# Patient Record
Sex: Female | Born: 1980
Health system: Southern US, Community
[De-identification: ages and names within clinical notes are randomized; demographics above are authoritative.]

## PROBLEM LIST (undated history)

## (undated) ENCOUNTER — Inpatient Hospital Stay (HOSPITAL_COMMUNITY): Payer: Self-pay

## (undated) DIAGNOSIS — E876 Hypokalemia: Secondary | ICD-10-CM

## (undated) DIAGNOSIS — M797 Fibromyalgia: Secondary | ICD-10-CM

## (undated) DIAGNOSIS — I10 Essential (primary) hypertension: Secondary | ICD-10-CM

## (undated) DIAGNOSIS — R0600 Dyspnea, unspecified: Secondary | ICD-10-CM

## (undated) HISTORY — DX: Hypokalemia: E87.6

## (undated) HISTORY — DX: Fibromyalgia: M79.7

## (undated) HISTORY — PX: TONSILLECTOMY: SUR1361

---

## 2000-12-22 ENCOUNTER — Emergency Department (HOSPITAL_COMMUNITY): Admission: EM | Admit: 2000-12-22 | Discharge: 2000-12-22 | Payer: Self-pay

## 2002-02-25 HISTORY — PX: OTHER SURGICAL HISTORY: SHX169

## 2004-03-22 ENCOUNTER — Emergency Department: Payer: Self-pay | Admitting: Emergency Medicine

## 2006-09-07 ENCOUNTER — Emergency Department: Payer: Self-pay | Admitting: Unknown Physician Specialty

## 2006-09-07 ENCOUNTER — Other Ambulatory Visit: Payer: Self-pay

## 2007-10-19 ENCOUNTER — Ambulatory Visit: Payer: Self-pay | Admitting: Unknown Physician Specialty

## 2007-11-02 ENCOUNTER — Emergency Department: Payer: Self-pay | Admitting: Emergency Medicine

## 2008-01-13 ENCOUNTER — Emergency Department: Payer: Self-pay | Admitting: Emergency Medicine

## 2008-05-31 ENCOUNTER — Emergency Department: Payer: Self-pay | Admitting: Emergency Medicine

## 2009-03-16 ENCOUNTER — Emergency Department: Payer: Self-pay | Admitting: Emergency Medicine

## 2009-06-15 ENCOUNTER — Emergency Department: Payer: Self-pay | Admitting: Emergency Medicine

## 2009-09-07 ENCOUNTER — Ambulatory Visit: Payer: Self-pay | Admitting: Otolaryngology

## 2009-09-25 ENCOUNTER — Emergency Department: Payer: Self-pay | Admitting: Emergency Medicine

## 2009-11-07 ENCOUNTER — Emergency Department: Payer: Self-pay | Admitting: Emergency Medicine

## 2009-11-16 ENCOUNTER — Emergency Department: Payer: Self-pay | Admitting: Emergency Medicine

## 2009-11-22 ENCOUNTER — Ambulatory Visit: Payer: Self-pay | Admitting: Obstetrics & Gynecology

## 2009-11-22 ENCOUNTER — Encounter: Payer: Self-pay | Admitting: Obstetrics & Gynecology

## 2009-11-22 LAB — CONVERTED CEMR LAB
Antibody Screen: NEGATIVE
Basophils Absolute: 0 10*3/uL (ref 0.0–0.1)
Basophils Relative: 0 % (ref 0–1)
Eosinophils Absolute: 0 10*3/uL (ref 0.0–0.7)
Eosinophils Relative: 0 % (ref 0–5)
HCT: 30.3 % — ABNORMAL LOW (ref 36.0–46.0)
Hemoglobin: 9.5 g/dL — ABNORMAL LOW (ref 12.0–15.0)
Hepatitis B Surface Ag: NEGATIVE
Lymphocytes Relative: 32 % (ref 12–46)
Lymphs Abs: 2 10*3/uL (ref 0.7–4.0)
MCHC: 31.4 g/dL (ref 30.0–36.0)
MCV: 81.5 fL (ref 78.0–100.0)
Monocytes Absolute: 0.5 10*3/uL (ref 0.1–1.0)
Monocytes Relative: 8 % (ref 3–12)
Neutro Abs: 3.6 10*3/uL (ref 1.7–7.7)
Neutrophils Relative %: 60 % (ref 43–77)
Platelets: 274 10*3/uL (ref 150–400)
RBC: 3.72 M/uL — ABNORMAL LOW (ref 3.87–5.11)
RDW: 15.9 % — ABNORMAL HIGH (ref 11.5–15.5)
Rh Type: POSITIVE
Rubella: 27 intl units/mL — ABNORMAL HIGH
WBC: 6.1 10*3/uL (ref 4.0–10.5)
hCG, Beta Chain, Quant, S: 59204 milliintl units/mL

## 2009-11-23 ENCOUNTER — Ambulatory Visit (HOSPITAL_COMMUNITY): Admission: RE | Admit: 2009-11-23 | Discharge: 2009-11-23 | Payer: Self-pay | Admitting: Anesthesiology

## 2009-11-27 ENCOUNTER — Ambulatory Visit: Payer: Self-pay | Admitting: Obstetrics & Gynecology

## 2009-11-30 ENCOUNTER — Ambulatory Visit (HOSPITAL_COMMUNITY): Admission: RE | Admit: 2009-11-30 | Discharge: 2009-11-30 | Payer: Self-pay | Admitting: Obstetrics & Gynecology

## 2009-11-30 ENCOUNTER — Ambulatory Visit: Payer: Self-pay | Admitting: Obstetrics & Gynecology

## 2010-05-10 LAB — BASIC METABOLIC PANEL
BUN: 6 mg/dL (ref 6–23)
CO2: 25 mEq/L (ref 19–32)
Calcium: 9.4 mg/dL (ref 8.4–10.5)
Chloride: 98 mEq/L (ref 96–112)
Creatinine, Ser: 0.5 mg/dL (ref 0.4–1.2)
GFR calc Af Amer: >60 mL/min (ref >60)
GFR calc non Af Amer: >60 mL/min (ref >60)
Glucose, Bld: 91 mg/dL (ref 70–99)
Potassium: 3.7 mEq/L (ref 3.5–5.1)
Sodium: 131 mEq/L — ABNORMAL LOW (ref 135–145)

## 2010-05-10 LAB — CBC
HCT: 32.1 % — ABNORMAL LOW (ref 36.0–46.0)
Hemoglobin: 10.7 g/dL — ABNORMAL LOW (ref 12.0–15.0)
MCH: 27.1 pg (ref 26.0–34.0)
MCHC: 33.3 g/dL (ref 30.0–36.0)
MCV: 81.2 fL (ref 78.0–100.0)
Platelets: 261 10*3/uL (ref 150–400)
RBC: 3.95 MIL/uL (ref 3.87–5.11)
RDW: 15.2 % (ref 11.5–15.5)
WBC: 6.9 10*3/uL (ref 4.0–10.5)

## 2010-10-11 ENCOUNTER — Ambulatory Visit: Payer: Self-pay

## 2010-10-20 ENCOUNTER — Ambulatory Visit: Payer: Self-pay | Admitting: Family Medicine

## 2011-03-18 ENCOUNTER — Ambulatory Visit: Payer: Self-pay

## 2011-05-16 ENCOUNTER — Ambulatory Visit: Payer: Self-pay

## 2011-05-27 ENCOUNTER — Ambulatory Visit: Payer: Self-pay

## 2011-09-17 ENCOUNTER — Ambulatory Visit: Payer: Self-pay | Admitting: Emergency Medicine

## 2011-09-17 LAB — URINALYSIS, COMPLETE
Blood: NEGATIVE
Ketone: NEGATIVE
Ph: 5 (ref 4.5–8.0)
Specific Gravity: >=1.03 (ref 1.003–1.030)

## 2011-09-18 LAB — URINE CULTURE

## 2012-01-11 ENCOUNTER — Ambulatory Visit: Payer: Self-pay | Admitting: Physician Assistant

## 2012-07-31 LAB — TSH: TSH: 1.01 u[IU]/mL (ref <=5.90)

## 2012-09-28 ENCOUNTER — Encounter (HOSPITAL_COMMUNITY): Payer: Self-pay | Admitting: Emergency Medicine

## 2012-09-28 ENCOUNTER — Emergency Department (HOSPITAL_COMMUNITY)
Admission: EM | Admit: 2012-09-28 | Discharge: 2012-09-28 | Disposition: A | Discharge status: Home / Self Care | Payer: 59 | Attending: Emergency Medicine | Admitting: Emergency Medicine

## 2012-09-28 DIAGNOSIS — Y92410 Unspecified street and highway as the place of occurrence of the external cause: Secondary | ICD-10-CM | POA: Insufficient documentation

## 2012-09-28 DIAGNOSIS — Z79899 Other long term (current) drug therapy: Secondary | ICD-10-CM | POA: Insufficient documentation

## 2012-09-28 DIAGNOSIS — R0602 Shortness of breath: Secondary | ICD-10-CM | POA: Insufficient documentation

## 2012-09-28 DIAGNOSIS — M79609 Pain in unspecified limb: Secondary | ICD-10-CM | POA: Insufficient documentation

## 2012-09-28 DIAGNOSIS — F17200 Nicotine dependence, unspecified, uncomplicated: Secondary | ICD-10-CM | POA: Insufficient documentation

## 2012-09-28 DIAGNOSIS — Y9389 Activity, other specified: Secondary | ICD-10-CM | POA: Insufficient documentation

## 2012-09-28 DIAGNOSIS — S90569A Insect bite (nonvenomous), unspecified ankle, initial encounter: Secondary | ICD-10-CM | POA: Insufficient documentation

## 2012-09-28 DIAGNOSIS — W57XXXA Bitten or stung by nonvenomous insect and other nonvenomous arthropods, initial encounter: Secondary | ICD-10-CM | POA: Insufficient documentation

## 2012-09-28 DIAGNOSIS — I10 Essential (primary) hypertension: Secondary | ICD-10-CM | POA: Insufficient documentation

## 2012-09-28 DIAGNOSIS — R209 Unspecified disturbances of skin sensation: Secondary | ICD-10-CM | POA: Insufficient documentation

## 2012-09-28 DIAGNOSIS — L299 Pruritus, unspecified: Secondary | ICD-10-CM | POA: Insufficient documentation

## 2012-09-28 HISTORY — DX: Essential (primary) hypertension: I10

## 2012-09-28 MED ORDER — DIPHENHYDRAMINE HCL 25 MG PO TABS: 25.0000 mg | ORAL_TABLET | Freq: Four times a day (QID) | ORAL | Status: DC

## 2012-09-28 MED ORDER — HYDROCORTISONE 1 % EX CREA: TOPICAL_CREAM | Freq: Two times a day (BID) | CUTANEOUS | Status: DC

## 2012-09-28 MED ORDER — DIPHENHYDRAMINE HCL 25 MG PO CAPS
50.0000 mg | ORAL_CAPSULE | Freq: Once | ORAL | Status: AC
  Administered 2012-09-28: 50 mg via ORAL
  Filled 2012-09-28: qty 2

## 2012-09-28 MED ORDER — KETOROLAC TROMETHAMINE 60 MG/2ML IM SOLN
60.0000 mg | Freq: Once | INTRAMUSCULAR | Status: AC
  Administered 2012-09-28: 60 mg via INTRAMUSCULAR
  Filled 2012-09-28: qty 2

## 2012-09-28 NOTE — ED Provider Notes (Signed)
CSN: [id number]     Arrival date & time 09/28/12  4098 History     First MD Initiated Contact with Patient 09/28/12 [phone]     Chief Complaint  Patient presents with  . Insect Bite   (Consider location/radiation/quality/duration/timing/severity/associated sxs/prior Treatment) HPI Comments: Patient is a 32 year old female who presents today after getting bitten by an insect while driving to work. She states that she began to feel short of breath as well as a tingling sensation in her arms and legs. At that time she pulled over and called EMS. Nothing makes her pain better, Palpation makes the pain from the bite on her proximal left thigh feel worse. The pain is a burning sensation. She also is beginning to itch diffusely over her body. She has never had pain like this in the past. She has been ambulatory since the time of the bite. She was feeling well prior to the bite. No fevers, chills, nausea, vomiting, abdominal pain, weakness.   The history is provided by the patient. No language interpreter was used.    No past medical history on file. No past surgical history on file. No family history on file. History  Substance Use Topics  . Smoking status: Not on file  . Smokeless tobacco: Not on file  . Alcohol Use: Not on file   OB History   No data available     Review of Systems  Constitutional: Negative for fever and chills.  Respiratory: Positive for shortness of breath.   Cardiovascular: Negative for chest pain.  Gastrointestinal: Negative for nausea, vomiting and abdominal pain.  Neurological: Positive for numbness. Negative for weakness.  All other systems reviewed and are negative.    Allergies  Review of patient's allergies indicates not on file.  Home Medications  No current outpatient prescriptions on file. BP 118/82  Pulse 82  Temp(Src) 98.3 F (36.8 C) (Oral)  Resp 16  SpO2 100%  LMP 09/27/2012 Physical Exam  Nursing note and vitals reviewed. Constitutional:  She is oriented to person, place, and time. Vital signs are normal. She appears well-developed and well-nourished.  Non-toxic appearance. She does not have a sickly appearance. She does not appear ill. No distress. She is not intubated.  HENT:  Head: Normocephalic and atraumatic.  Right Ear: External ear normal.  Left Ear: External ear normal.  Nose: Nose normal.  Mouth/Throat: Uvula is midline, oropharynx is clear and moist and mucous membranes are normal.  Eyes: Conjunctivae are normal.  Neck: Normal range of motion.  Cardiovascular: Normal rate, regular rhythm, normal heart sounds, intact distal pulses and normal pulses.   Pulmonary/Chest: Effort normal and breath sounds normal. No accessory muscle usage or stridor. No apnea, not tachypneic and not bradypneic. She is not intubated. No respiratory distress. She has no wheezes. She has no rales.  Maintaining own secretions. No distress. Speaking in full sentences.   Abdominal: Soft. She exhibits no distension.  Musculoskeletal: Normal range of motion.  Neurological: She is alert and oriented to person, place, and time. She has normal strength. No sensory deficit. Coordination and gait normal.  strength 5/5 in all extremities  Skin: Skin is warm and dry. She is not diaphoretic. There is erythema.  2 cm area of erythema without induration or fluctuance on left anterior proximal thigh. There is currently a circle drawn around the erythema.   Psychiatric: She has a normal mood and affect. Her behavior is normal.    ED Course   Procedures (including critical care  time)  Labs Reviewed - No data to display No results found. 1. Insect bite     MDM  Patient with insect bite to her left leg. No signs of cellulitis, infection. Mild allergic reaction. Patient re-evaluated prior to dc, is hemodynamically stable, in no respiratory distress, and denies the feeling of throat closing. Pt has been advised to take OTC benadryl & return to the ED if they  have a mod-severe allergic rxn (s/s including throat closing, difficulty breathing, swelling of lips face or tongue). Pt is to follow up with their PCP. Pt is agreeable with plan & verbalizes understanding.   Mora Bellman, PA-C 09/28/12 1127

## 2012-09-28 NOTE — ED Notes (Signed)
TO ED via [facility] -- with c/o insect bite -- unsure of what type of insect- now states "feels funny" c/o tingling feeling in arms -- 1 hive/bite on left upper leg- red/slightly swollen. Ambulatory from ambulance to room A4 with no resp distress

## 2012-10-01 NOTE — ED Provider Notes (Signed)
Medical screening examination/treatment/procedure(s) were performed by non-physician practitioner and as supervising physician I was immediately available for consultation/collaboration.  Candyce Churn, MD 10/01/12 [phone]

## 2013-05-03 ENCOUNTER — Ambulatory Visit: Payer: Self-pay | Admitting: Physician Assistant

## 2013-09-11 ENCOUNTER — Ambulatory Visit: Payer: Self-pay

## 2013-09-11 LAB — URINALYSIS, COMPLETE
BACTERIA: NEGATIVE
BILIRUBIN, UR: NEGATIVE
GLUCOSE, UR: NEGATIVE mg/dL (ref 0–75)
Ketone: NEGATIVE
Leukocyte Esterase: NEGATIVE
Nitrite: NEGATIVE
PH: 5 (ref 4.5–8.0)
PROTEIN: NEGATIVE
RBC, UR: NONE SEEN /HPF (ref 0–5)
Specific Gravity: 1.015 (ref 1.003–1.030)

## 2013-09-11 LAB — PREGNANCY, URINE: Pregnancy Test, Urine: NEGATIVE m[IU]/mL

## 2013-09-13 LAB — URINE CULTURE

## 2013-11-10 ENCOUNTER — Encounter (HOSPITAL_COMMUNITY): Payer: Self-pay | Admitting: *Deleted

## 2013-11-10 ENCOUNTER — Inpatient Hospital Stay (HOSPITAL_COMMUNITY)
Admission: AD | Admit: 2013-11-10 | Discharge: 2013-11-10 | Discharge status: Against Medical Advice | Payer: 59 | Source: Ambulatory Visit | Attending: Obstetrics & Gynecology | Admitting: Obstetrics & Gynecology

## 2013-11-10 ENCOUNTER — Inpatient Hospital Stay (HOSPITAL_COMMUNITY): Payer: 59

## 2013-11-10 DIAGNOSIS — R109 Unspecified abdominal pain: Secondary | ICD-10-CM

## 2013-11-10 DIAGNOSIS — O99330 Smoking (tobacco) complicating pregnancy, unspecified trimester: Secondary | ICD-10-CM | POA: Insufficient documentation

## 2013-11-10 DIAGNOSIS — O99891 Other specified diseases and conditions complicating pregnancy: Secondary | ICD-10-CM | POA: Insufficient documentation

## 2013-11-10 DIAGNOSIS — O26899 Other specified pregnancy related conditions, unspecified trimester: Secondary | ICD-10-CM

## 2013-11-10 DIAGNOSIS — O9989 Other specified diseases and conditions complicating pregnancy, childbirth and the puerperium: Principal | ICD-10-CM

## 2013-11-10 DIAGNOSIS — O161 Unspecified maternal hypertension, first trimester: Secondary | ICD-10-CM

## 2013-11-10 LAB — CBC
HCT: 30.6 % — ABNORMAL LOW (ref 36.0–46.0)
Hemoglobin: 9.7 g/dL — ABNORMAL LOW (ref 12.0–15.0)
MCH: 22.3 pg — AB (ref 26.0–34.0)
MCHC: 31.7 g/dL (ref 30.0–36.0)
MCV: 70.3 fL — ABNORMAL LOW (ref 78.0–100.0)
PLATELETS: 316 10*3/uL (ref 150–400)
RBC: 4.35 MIL/uL (ref 3.87–5.11)
RDW: 16.7 % — AB (ref 11.5–15.5)
WBC: 7.1 10*3/uL (ref 4.0–10.5)

## 2013-11-10 LAB — HCG, QUANTITATIVE, PREGNANCY: HCG, BETA CHAIN, QUANT, S: 8225 m[IU]/mL — AB (ref <5)

## 2013-11-10 LAB — WET PREP, GENITAL
Trich, Wet Prep: NONE SEEN
Yeast Wet Prep HPF POC: NONE SEEN

## 2013-11-10 LAB — URINALYSIS, ROUTINE W REFLEX MICROSCOPIC
Bilirubin Urine: NEGATIVE
Glucose, UA: NEGATIVE mg/dL
Hgb urine dipstick: NEGATIVE
Ketones, ur: NEGATIVE mg/dL
LEUKOCYTES UA: NEGATIVE
NITRITE: NEGATIVE
PH: 6.5 (ref 5.0–8.0)
Protein, ur: NEGATIVE mg/dL
SPECIFIC GRAVITY, URINE: 1.01 (ref 1.005–1.030)
UROBILINOGEN UA: 0.2 mg/dL (ref 0.0–1.0)

## 2013-11-10 LAB — POCT PREGNANCY, URINE: Preg Test, Ur: POSITIVE — AB

## 2013-11-10 MED ORDER — HYDROCORTISONE 1 % EX CREA: TOPICAL_CREAM | Freq: Two times a day (BID) | CUTANEOUS | Status: DC

## 2013-11-10 MED ORDER — DIPHENHYDRAMINE HCL 25 MG PO TABS: 25.0000 mg | ORAL_TABLET | Freq: Four times a day (QID) | ORAL | Status: DC

## 2013-11-10 NOTE — MAU Note (Signed)
Pt states she has had a home pregnancy test that was positive. Pt states she started having cramping today "at the bottom of my stomach"

## 2013-11-10 NOTE — Progress Notes (Signed)
Unable to evaluate pt's pain pt left AMA

## 2013-11-10 NOTE — MAU Note (Signed)
Pt returned called pt informed that provider wanted to change her BP medication and that she would get a call from ultrasound for a follow up ultrasound in 1 week

## 2013-11-10 NOTE — MAU Note (Addendum)
Pt called and message left for pt to return call. For Follow up instructions. Pt left prior to discharge.

## 2013-11-10 NOTE — MAU Provider Note (Signed)
History     CSN: [id number]  Arrival date and time: 11/10/13 1511   First Provider Initiated Contact with Patient 11/10/13 1550      Chief Complaint  Patient presents with  . Abdominal Pain  . Possible Pregnancy   HPI Theresa Bowen 33 y.o. [id number] presents to MAU with complaints of cramping.  LMP was 10/04/13.  1 week ago she had a positive home pregnancy test after feeling tired.  3-4 days ago she noticed some cramping that has become more like a pressure all along her lower abdomen and lower back.  Pain is rated 7/10.  She also complains of bloating and leg cramps at night.  She has no dietary changes.  She denies vaginal bleeding/discharge/dysuria.   She has recently noted congestion and shortness of breath but this is typical and related to allergies.  OB History   Grav Para Term Preterm Abortions TAB SAB Ect Mult Living   4 1 1  1   1  1       Past Medical History  Diagnosis Date  . Hypertension     Past Surgical History  Procedure Laterality Date  . Tonsillectomy    . C-section  2004    No family history on file.  History  Substance Use Topics  . Smoking status: Current Every Day Smoker  . Smokeless tobacco: Not on file  . Alcohol Use: No    Allergies: No Known Allergies  Prescriptions prior to admission  Medication Sig Dispense Refill  . acetaminophen (TYLENOL) 325 MG tablet Take 650 mg by mouth once.      . diphenhydrAMINE (BENADRYL) 25 MG tablet Take 1 tablet (25 mg total) by mouth every 6 (six) hours.  20 tablet  0  . hydrocortisone cream 1 % Apply topically 2 (two) times daily.  20 g  0  . lisinopril-hydrochlorothiazide (PRINZIDE,ZESTORETIC) 20-12.5 MG per tablet Take 1 tablet by mouth daily.        Review of Systems  Constitutional: Negative for fever, chills and diaphoresis.  HENT: Positive for congestion. Negative for hearing loss and sore throat.   Respiratory: Positive for shortness of breath. Negative for cough and wheezing.    Cardiovascular: Negative for chest pain and palpitations.  Gastrointestinal: Positive for abdominal pain. Negative for heartburn, nausea, vomiting, diarrhea and constipation.  Genitourinary: Positive for frequency. Negative for dysuria and hematuria.  Musculoskeletal: Positive for back pain.  Skin: Negative for itching and rash.  Neurological: Negative for dizziness, tingling, weakness and headaches.  Psychiatric/Behavioral: Negative for depression, suicidal ideas and substance abuse. The patient is not nervous/anxious.    Physical Exam   Blood pressure 145/79, pulse 122, temperature 99.2 F (37.3 C), temperature source Oral, resp. rate 18, height 5' 1.5" (1.562 m), weight 199 lb (90.266 kg), last menstrual period 10/04/2013.  Physical Exam  Constitutional: She is oriented to person, place, and time. She appears well-developed and well-nourished.  HENT:  Head: Normocephalic and atraumatic.  Eyes: EOM are normal.  Neck: Normal range of motion.  Cardiovascular: Normal rate, regular rhythm and normal heart sounds.   Respiratory: Effort normal and breath sounds normal. No respiratory distress.  GI: Soft. Bowel sounds are normal. She exhibits no distension. There is no tenderness.  Genitourinary:  Moderate amt of thin, white frothy discharge in vagina Cervix is closed.  No CMT/Adnexal tenderness or mass.  Musculoskeletal: Normal range of motion.  Neurological: She is alert and oriented to person, place, and time.  Skin: Skin is warm and  dry.  Psychiatric: She has a normal mood and affect.   Results for orders placed during the hospital encounter of 11/10/13 (from the past 24 hour(s))  URINALYSIS, ROUTINE W REFLEX MICROSCOPIC     Status: Abnormal   Collection Time    11/10/13  3:25 PM      Result Value Ref Range   Color, Urine STRAW (*) YELLOW   APPearance CLEAR  CLEAR   Specific Gravity, Urine 1.010  1.005 - 1.030   pH 6.5  5.0 - 8.0   Glucose, UA NEGATIVE  NEGATIVE mg/dL   Hgb  urine dipstick NEGATIVE  NEGATIVE   Bilirubin Urine NEGATIVE  NEGATIVE   Ketones, ur NEGATIVE  NEGATIVE mg/dL   Protein, ur NEGATIVE  NEGATIVE mg/dL   Urobilinogen, UA 0.2  0.0 - 1.0 mg/dL   Nitrite NEGATIVE  NEGATIVE   Leukocytes, UA NEGATIVE  NEGATIVE  POCT PREGNANCY, URINE     Status: Abnormal   Collection Time    11/10/13  3:29 PM      Result Value Ref Range   Preg Test, Ur POSITIVE (*) NEGATIVE  HCG, QUANTITATIVE, PREGNANCY     Status: Abnormal   Collection Time    11/10/13  4:28 PM      Result Value Ref Range   hCG, Beta Chain, Quant, S 8225 (*) <5 mIU/mL  CBC     Status: Abnormal   Collection Time    11/10/13  4:29 PM      Result Value Ref Range   WBC 7.1  4.0 - 10.5 K/uL   RBC 4.35  3.87 - 5.11 MIL/uL   Hemoglobin 9.7 (*) 12.0 - 15.0 g/dL   HCT 30.6 (*) 36.0 - 46.0 %   MCV 70.3 (*) 78.0 - 100.0 fL   MCH 22.3 (*) 26.0 - 34.0 pg   MCHC 31.7  30.0 - 36.0 g/dL   RDW 16.7 (*) 11.5 - 15.5 %   Platelets 316  150 - 400 K/uL  WET PREP, GENITAL     Status: Abnormal   Collection Time    11/10/13  5:00 PM      Result Value Ref Range   Yeast Wet Prep HPF POC NONE SEEN  NONE SEEN   Trich, Wet Prep NONE SEEN  NONE SEEN   Clue Cells Wet Prep HPF POC FEW (*) NONE SEEN   WBC, Wet Prep HPF POC FEW (*) NONE SEEN   US Ob Comp Less 14 Wks  11/10/2013   CLINICAL DATA:  Cramping for 2 days. LMP 10/04/2013 very by LMP the patient is 5 weeks 2 days. Gravida 4 para 1. History of previous ectopic pregnancy. Quantitative beta HCG is 8,225.  EXAM: OBSTETRIC <14 WK [place] AND TRANSVAGINAL OB US  TECHNIQUE: Both transabdominal and transvaginal ultrasound examinations were performed for complete evaluation of the gestation as well as the maternal uterus, adnexal regions, and pelvic cul-de-sac. Transvaginal technique was performed to assess early pregnancy.  COMPARISON:  None applicable  FINDINGS: Intrauterine gestational sac: Present and irregular in shape  Yolk sac:  Present  Embryo:  Not seen  Cardiac  Activity: Not seen  MSD:  9.9  mm   5 w   5  d  Maternal uterus/adnexae: Right ovary is 4.7 x 2.5 x 2.6 cm. Posterior to the right ovary there is a structure measuring 2.9 x 1.7 x 2.2 cm. This appears discrete from the ovary and may represent soft tissue mass or bowel. The left ovary has a normal  appearance. There is a small amount of free pelvic fluid.  IMPRESSION: 1. Probable intrauterine gestational sac with yolk sac. No embryo is yet visible. 2. Right adnexal structure adjacent to the ovary but discrete from the ovary. Ectopic pregnancy is not entirely excluded. Serial quantitative beta HCG values and follow-up ultrasound are recommended to document progression of pregnancy and to exclude ectopic pregnancy. 3. Critical Value/emergent results were called by telephone at the time of interpretation on 11/10/2013 at 6:37 pm to [facility] , who verbally acknowledged these results.   Electronically Signed   By: Shon Hale M.D.   On: 11/10/2013 18:37   US Ob Transvaginal  11/10/2013   CLINICAL DATA:  Cramping for 2 days. LMP 10/04/2013 very by LMP the patient is 5 weeks 2 days. Gravida 4 para 1. History of previous ectopic pregnancy. Quantitative beta HCG is 8,225.  EXAM: OBSTETRIC <14 WK [place] AND TRANSVAGINAL OB US  TECHNIQUE: Both transabdominal and transvaginal ultrasound examinations were performed for complete evaluation of the gestation as well as the maternal uterus, adnexal regions, and pelvic cul-de-sac. Transvaginal technique was performed to assess early pregnancy.  COMPARISON:  None applicable  FINDINGS: Intrauterine gestational sac: Present and irregular in shape  Yolk sac:  Present  Embryo:  Not seen  Cardiac Activity: Not seen  MSD:  9.9  mm   5 w   5  d  Maternal uterus/adnexae: Right ovary is 4.7 x 2.5 x 2.6 cm. Posterior to the right ovary there is a structure measuring 2.9 x 1.7 x 2.2 cm. This appears discrete from the ovary and may represent soft tissue mass or bowel. The left ovary has a  normal appearance. There is a small amount of free pelvic fluid.  IMPRESSION: 1. Probable intrauterine gestational sac with yolk sac. No embryo is yet visible. 2. Right adnexal structure adjacent to the ovary but discrete from the ovary. Ectopic pregnancy is not entirely excluded. Serial quantitative beta HCG values and follow-up ultrasound are recommended to document progression of pregnancy and to exclude ectopic pregnancy. 3. Critical Value/emergent results were called by telephone at the time of interpretation on 11/10/2013 at 6:37 pm to [facility] , who verbally acknowledged these results.   Electronically Signed   By: Shon Hale M.D.   On: 11/10/2013 18:37     MAU Course  Procedures  MDM Discussed with Dr. Glo Herring.  He advises: pt to return in 1 week for an U/S to confirm viability.    Assessment and Plan  A: Abdominal pain in pregnancy  P: Pt left AMA.  RN to call pt.   Return to MAU in 1 week for U/S to confirm viability.   Precautions to return to MAU for emergency in the meantime: bleeding/severe pain/fever/etc. Planned to discuss BP with pt and need for change in medications.  Will need to address with provider asap.    Paticia Stack 11/10/2013, 4:03 PM

## 2013-11-10 NOTE — MAU Note (Signed)
2+HPT last wk.  Started having cramping and pressure in lower abd and back.  Denies any bleeding;

## 2013-11-11 ENCOUNTER — Other Ambulatory Visit: Payer: Self-pay | Admitting: Medical

## 2013-11-11 DIAGNOSIS — O161 Unspecified maternal hypertension, first trimester: Secondary | ICD-10-CM

## 2013-11-11 LAB — GC/CHLAMYDIA PROBE AMP
CT Probe RNA: NEGATIVE
GC Probe RNA: NEGATIVE

## 2013-11-11 LAB — HIV ANTIBODY (ROUTINE TESTING W REFLEX): HIV: NONREACTIVE

## 2013-11-11 MED ORDER — LABETALOL HCL 200 MG PO TABS: 200.0000 mg | ORAL_TABLET | Freq: Two times a day (BID) | ORAL | Status: DC

## 2013-11-11 NOTE — Progress Notes (Signed)
Patient was seen in MAU on 11/10/13. Confirmed early pregnancy. Patient left AMA prior to discharge. Patient called MAU today stating that PA who saw her yesterday was supposed to change her BP medication. Patient is currently taking Lisinopril. Rx for Labetalol sent to patient's pharmacy. Pharmacy of choice confirmed. Initial dose of Labetalol confirmed with Dr. Ihor Dow.   Luvenia Redden, PA-C 11/11/2013 11:58 AM

## 2013-11-15 ENCOUNTER — Emergency Department: Payer: Self-pay | Admitting: Emergency Medicine

## 2013-11-15 NOTE — MAU Provider Note (Signed)
Attestation of Attending Supervision of Advanced Practitioner: Evaluation and management procedures were performed by the PA/NP/CNM/OB Fellow under my supervision/collaboration. Chart reviewed and agree with management and plan.  Theresa Bowen 11/15/2013 3:09 PM

## 2013-12-27 ENCOUNTER — Encounter (HOSPITAL_COMMUNITY): Payer: Self-pay | Admitting: *Deleted

## 2014-02-25 HISTORY — PX: TUBAL LIGATION: SHX77

## 2014-05-18 ENCOUNTER — Ambulatory Visit: Payer: Self-pay

## 2014-06-03 ENCOUNTER — Inpatient Hospital Stay
Admit: 2014-06-03 | Disposition: A | Discharge status: Home / Self Care | Payer: Self-pay | Attending: Obstetrics and Gynecology | Admitting: Obstetrics and Gynecology

## 2014-06-04 LAB — CBC WITH DIFFERENTIAL/PLATELET
BASOS PCT: 0.2 %
Basophil #: 0 10*3/uL (ref 0.0–0.1)
EOS PCT: 0.1 %
Eosinophil #: 0 10*3/uL (ref 0.0–0.7)
HCT: 27.4 % — AB (ref 35.0–47.0)
HGB: 8.2 g/dL — AB (ref 12.0–16.0)
LYMPHS ABS: 1.2 10*3/uL (ref 1.0–3.6)
Lymphocyte %: 11.9 %
MCH: 20.4 pg — ABNORMAL LOW (ref 26.0–34.0)
MCHC: 30 g/dL — ABNORMAL LOW (ref 32.0–36.0)
MCV: 68 fL — AB (ref 80–100)
MONOS PCT: 6.4 %
Monocyte #: 0.6 x10 3/mm (ref 0.2–0.9)
NEUTROS ABS: 8.1 10*3/uL — AB (ref 1.4–6.5)
Neutrophil %: 81.4 %
Platelet: 229 10*3/uL (ref 150–440)
RBC: 4.02 10*6/uL (ref 3.80–5.20)
RDW: 17.3 % — AB (ref 11.5–14.5)
WBC: 10 10*3/uL (ref 3.6–11.0)

## 2014-06-04 LAB — BASIC METABOLIC PANEL
ANION GAP: 7 (ref 7–16)
BUN: <5 mg/dL
CALCIUM: 7.9 mg/dL — AB
CO2: 23 mmol/L
CREATININE: 0.37 mg/dL — AB
Chloride: 108 mmol/L
GLUCOSE: 117 mg/dL — AB
Potassium: 3.1 mmol/L — ABNORMAL LOW
Sodium: 138 mmol/L

## 2014-06-04 LAB — URINALYSIS, COMPLETE
BLOOD: NEGATIVE
Bilirubin,UR: NEGATIVE
Glucose,UR: NEGATIVE mg/dL (ref 0–75)
Ketone: NEGATIVE
Leukocyte Esterase: NEGATIVE
NITRITE: NEGATIVE
Ph: 7 (ref 4.5–8.0)
SPECIFIC GRAVITY: 1.005 (ref 1.003–1.030)

## 2014-06-04 LAB — PROTEIN / CREATININE RATIO, URINE
CREATININE, URINE RANDOM: 38 mg/dL (ref 30–125)
Protein, Urine: 37 mg/dL (ref 0–9)
Protein/Creat. Ratio: 974 mg/gCREAT — ABNORMAL HIGH (ref 0–200)

## 2014-06-04 LAB — PIH PROFILE
ANION GAP: 7 (ref 7–16)
AST: 21 U/L
BUN: 5 mg/dL — ABNORMAL LOW
CALCIUM: 8.7 mg/dL — AB
CO2: 22 mmol/L
Chloride: 108 mmol/L
Creatinine: 0.39 mg/dL — ABNORMAL LOW
EGFR (African American): >60
Glucose: 101 mg/dL — ABNORMAL HIGH
HCT: 25.2 % — AB (ref 35.0–47.0)
HGB: 7.9 g/dL — ABNORMAL LOW (ref 12.0–16.0)
MCH: 21.2 pg — ABNORMAL LOW (ref 26.0–34.0)
MCHC: 31.6 g/dL — AB (ref 32.0–36.0)
MCV: 67 fL — ABNORMAL LOW (ref 80–100)
PLATELETS: 211 10*3/uL (ref 150–440)
POTASSIUM: 2.9 mmol/L — AB
RBC: 3.75 10*6/uL — ABNORMAL LOW (ref 3.80–5.20)
RDW: 16.7 % — AB (ref 11.5–14.5)
Sodium: 137 mmol/L
Uric Acid: 3.5 mg/dL
WBC: 8.9 10*3/uL (ref 3.6–11.0)

## 2014-06-05 LAB — HEMATOCRIT: HCT: 23.7 % — ABNORMAL LOW (ref 35.0–47.0)

## 2014-06-06 LAB — BASIC METABOLIC PANEL
Anion Gap: 8 (ref 7–16)
BUN: <5 mg/dL
CALCIUM: 8.8 mg/dL — AB
CHLORIDE: 107 mmol/L
CO2: 23 mmol/L
CREATININE: 0.48 mg/dL
GLUCOSE: 87 mg/dL
POTASSIUM: 3.3 mmol/L — AB
SODIUM: 138 mmol/L

## 2014-06-20 LAB — SURGICAL PATHOLOGY

## 2014-06-26 NOTE — Consult Note (Signed)
Brief Consult Note: Diagnosis: Pre eclampsia, chronic HTN.   Patient was seen by consultant.   Consult note dictated.   Orders entered.   Discussed with Attending MD.   Comments: 33y/oF with chronic High Blood Pressure (Hypertension) on lisinopril as outpt, now post partum- delivered at 34weeks for pre eclampsia with elevated BP  * Chronic High Blood Pressure (Hypertension) - now worse with oreeclampsia. lisinopril- stopped during pregnancy. on labetelol- dose increased to 600mg  bid and still BP elevated add procardia (pr is breast feeding) adn monitor will need to be followed as outpt to titrate the meds  * Abd distention- simethicone has been added, add colace as well as on pain meds and constipated  * Anemia- iron supplements  * TEDs added for DVT prophylaxis.  Electronic Signatures: Gladstone Lighter (MD)  (Signed 11-Apr-16 11:41)  Authored: Brief Consult Note   Last Updated: 11-Apr-16 11:41 by Gladstone Lighter (MD)

## 2014-06-26 NOTE — Consult Note (Signed)
PATIENT NAME:  Theresa Bowen, Theresa Bowen MR#:  [id number] DATE OF BIRTH:  1980-03-06  DATE OF CONSULTATION:  06/06/2014  REFERRING PHYSICIAN:  Prentice Docker, MD CONSULTING PHYSICIAN:  Gladstone Lighter, MD  ADMITTING PHYSICIAN: Gladstone Lighter, MD  PRIMARY CARE PHYSICIAN: Halina Maidens, MD   REASON FOR CONSULTATION: Postpartum hypertension management.   HISTORY OF PRESENT ILLNESS: Ms. Mallen is a 34 year old African American female with past medical history significant for chronic hypertension, taking lisinopril prior to the pregnancy, presents to the hospital during her 34th week for worsening blood pressure symptoms. The patient had a C-section and delivery done on June 04, 2014. During her pregnancy she was changed over from lisinopril to labetalol 100 mg p.o. b.i.d., which has maintained her blood pressure up until recently when her blood pressure was elevated for which she was admitted, diagnosed with preeclampsia. Right now her labetalol has been titrated up to 600 mg p.o. b.i.d. and still her systolic has been in the range of 150 to 160. The patient says this is her second pregnancy and even during her first pregnancy she had worsening of her chronic hypertension and needed several medications to control it. She denies any headache, other than abdominal pain. No chest pain, dyspnea, or other symptoms.   PAST MEDICAL HISTORY: Chronic hypertension and also history of preeclampsia, worsening hypertension during her first pregnancy.   PAST SURGICAL HISTORY:  1.  Prior C-sections.  2.  Tonsillectomy.   ALLERGIES TO MEDICATIONS: No known drug allergies.   CURRENT MEDICATIONS: 1.  Tylox 5/325 mg 1 to 2 tablets every 4 hours as needed for pain.  2.  Ferrous sulfate 325 mg oral p.o. b.i.d.  3.  Labetalol 600 mg p.o. b.i.d.  4.  Multivitamin 1 tablet p.o. daily.  5.  Colace 100 mg p.o. b.i.d.  6.  Simethicone 80 mg before meals and at bedtime as needed for distention.   SOCIAL HISTORY: Lives  at home with her husband. Has a prior 66 year old daughter. This is her second child. Used to smoke about 1/2 pack per day but completely stopped during pregnancy. No alcohol use.   FAMILY HISTORY: Both parents with hypertension. Has a younger brother who also is diagnosed with hypertension. On mother's side of the family, there is diabetes.   REVIEW OF SYSTEMS: CONSTITUTIONAL: No fever, fatigue, or weakness.  EYES: No blurred vision, double vision, inflammation or glaucoma.  ENT: No tinnitus, ear pain, hearing loss, epistaxis or discharge.  RESPIRATORY: No cough, wheezing, hemoptysis or COPD. CARDIOVASCULAR: No chest pain, orthopnea, edema, arrhythmia, palpitations, or syncope.  GASTROINTESTINAL: No nausea, vomiting. Positive for abdominal pain. No hematemesis or melena.  GENITOURINARY: No dysuria, hematuria, renal calculus, frequency, or incontinence.  ENDOCRINE: No polyuria, nocturia, thyroid problems, heat or cold intolerance.  HEMATOLOGY: Positive for anemia. No easy bruising or bleeding.  SKIN: No acne, rash or lesions.  MUSCULOSKELETAL: No neck fracture, pain, arthritis or gout.  NEUROLOGIC: No numbness, weakness, CVA, TIA or seizures.  PSYCHOLOGICAL: No anxiety, insomnia or depression.   PHYSICAL EXAMINATION: VITAL SIGNS: Temperature 98.5 degrees Fahrenheit, pulse 86, respirations 18, blood pressure 145/91 and pulse ox 98% on room air.  GENERAL: Heavily built, well-nourished female sitting in bed, not in any acute distress.  HEENT: Normocephalic, atraumatic. Pupils equal, round, and reacting to light. Anicteric sclerae. Extraocular movements intact. Oropharynx is clear without erythema, mass, or exudates.  NECK: Supple. No thyromegaly, JVD or carotid bruits. No lymphadenopathy.  LUNGS: Moving air bilaterally. No wheeze or crackles. No of accessory muscles  for breathing.  HEART:  S1 and S2 regular rate and rhythm. She does have a 3/6 systolic murmur. No rubs or gallops.  ABDOMEN:  Obese, soft, nontender. Mild discomfort in the lower abdomen where her incision is present, but normal bowel sounds.  EXTREMITIES: No pedal edema. No clubbing.  EXTREMITIES: She does have 1+ edema in the legs, no clubbing or cyanosis, 2+ dorsalis pedis pulses palpable bilaterally.  SKIN: No acne, rash or lesions.  LYMPHATICS: No cervical or inguinal lymphadenopathy.  NEUROLOGIC: Cranial nerves intact. No focal motor or sensory deficits.  PSYCHOLOGICAL: The patient is awake, alert and oriented x3.   LABORATORY DATA: WBC 10, hemoglobin 8.2, hematocrit 27.4, platelet count 229,000.   Sodium 138, potassium 3.1, chloride 108, bicarb 23, BUN less than 5, creatinine 0.37, glucose 117 and calcium 7.9.  Urinalysis negative for any infection.   ASSESSMENT AND PLAN: A 34 year old female with chronic hypertension, on lisinopril as outpatient, now postpartum, has preeclampsia, delivered at 34 weeks, and elevated blood pressure.  1.  Chronic hypertension with preeclampsia, now worse. Lisinopril has been stopped once pregnancy started and was on labetalol. Dose titrated to 600 p.o. b.i.d. labetalol and still blood pressure elevated so will add Procardia and monitor. The patient will need to be monitored as outpatient as well to titrate the medications.  2.  Abdominal distention. Continue simethicone. I also added Colace as the patient on pain medications and is constipated.  3.  Anemia. Iron supplements.  4.  Hyperkalemia 2 days ago. Repeat labs to check that.  5.  TEDS added for DVT and her pedal edema.   CODE STATUS: FULL.  TOTAL TIME SPENT ON CONSULTATION: 50 minutes.   ____________________________ Gladstone Lighter, MD rk:sb D: 06/06/2014 13:55:49 ET T: 06/06/2014 14:17:21 ET JOB#: [id number]  cc: Gladstone Lighter, MD, <Dictator> [facility] Halina Maidens, MD Gladstone Lighter MD ELECTRONICALLY SIGNED 06/17/2014 14:49

## 2014-06-26 NOTE — Op Note (Signed)
PATIENT NAME:  Theresa Bowen, Theresa Bowen MR#:  [id number] DATE OF BIRTH:  [birth year]  DATE OF PROCEDURE:  06/04/2014  PREOPERATIVE DIAGNOSES: 90.  A 35 year old G3, P1-0-1-1 with superimposed preeclampsia with severe features at 34 weeks 5 days.  2.  History of prior cesarean section.  3.  Desires permanent surgical sterilization.   POSTOPERATIVE DIAGNOSES:  96.  A 34 year old G3, P1-0-1-1 with superimposed preeclampsia with severe features at 34 weeks 5 days.  2.  History of prior cesarean section.  3.  Desires permanent surgical sterilization.   PROCEDURE PERFORMED: Repeat low transverse cesarean section via Pfannenstiel skin incision and bilateral partial salpingectomy using a Pomeroy method.  PRIMARY SURGEON: Malachy Mood, M.D.   ASSISTANT: Prentice Docker, MD  ANESTHESIA: Spinal.   PREOPERATIVE ANTIBIOTICS: 2 grams Ancef.  DRAINS OR TUBES: Foley to gravity drainage, On-Q catheter system.   ESTIMATED BLOOD LOSS: 750 mL.  OPERATIVE FLUIDS: 2 liters.   URINE OUTPUT: 250 mL.   COMPLICATIONS: None.   FINDINGS: Normal tubes, uterus and ovaries. Minimal scarring of the rectus fascia. Delivery resulting in the birth of a liveborn female infant weighing 2500 grams (5 pounds, 8 ounces). Apgars 7 and 9.   SPECIMENS REMOVED: Portion of right and left fallopian tube.   CONDITION FOLLOWING PROCEDURE: Stable.   PROCEDURE IN DETAIL: Risks, benefits, and alternatives of the procedure were discussed with the patient prior to proceeding to the operating room. The patient was taken to the operating room where she was administered spinal anesthesia. She was positioned in the supine position, prepped and draped in the usual sterile fashion. A timeout was performed. The level of anesthetic was checked and noted to be adequate prior to proceeding with the case. A Pfannenstiel skin incision was made utilizing the patient's pre-existing scar which was carried down to the level of the rectus fascia. The  fascia was incised in the midline. The fascial incision was extended using Mayo scissors. The superior border of the rectus fascia was grasped with 2 Kocher clamps. The underlying rectus muscles were dissected off the fascia bluntly and the median raphe incised using Mayo scissors. Inferior border of the rectus fascia was dissected off the rectus muscle in a similar fashion. The midline was identified and the rectus muscles were separated in the midline using a hemostat. The peritoneum was identified, grasped and entered using Metzenbaum scissors after transilluminating the peritoneum to verify a clear window. The peritoneal opening was then extended using manual traction. A bladder blade was placed. A low transverse incision was scored on the uterus. The uterus was entered bluntly using the operator's finger. The uterine incision was extended using manual traction. Amniotomy was performed at time of uterine entry and noted clear fluid. The vertex was noted to be in OA position, grasped, flexed, brought to the incision, delivered atraumatically using fundal pressure. The remainder of the body delivered with ease. The infant was suctioned, cord was clamped and cut and the infant was passed to the awaiting pediatrician. The placenta was delivered using manual extraction. The uterus was then exteriorized, wiped clean of clots and debris using 2 moist laps. The hysterotomy incision was closed using a single layer closure of 0 Vicryl in a running lock fashion. Attention was then turned to the patient's right tube. The right tube was grasped with a Babcock clamp in the mid isthmic portion and doubly ligated using a Pomeroy method and a 0 chromic. The intervening knuckle of tube was then excised. Both tubal ostia were clearly visualized.  The pedicle was noted to be hemostatic. This was then completed on the patient's left tube. There was some adhesions of the left tube to the left ovary and the pedicle slipped out of its  tie requiring the pedicles to be religated. This was accomplished by grasping the knuckle of tube in a hemostat and then using a #1 Vicryl free tie to tie down the knuckle of tube. The hysterotomy incision was reinspected and noted be hemostatic. The uterus was returned to the abdomen. Both tubal sites were reinspected and noted to be hemostatic as well. The peritoneal gutters were wiped clean of clots and debris using 2 moist laps. The rectus muscles were reapproximated in the midline using a 0 Vicryl mattress stitch. The superior border of the rectus fascia was grasped with a Kocher clamp. The On-Q catheters were placed subfascially per the usual protocol. The fascia was then closed using a single looped #1 PDS in a running fashion. Subcutaneous tissue was irrigated. Hemostasis was achieved using the Bovie. Skin was closed using staples. Sponge, needle, and instrument counts were correct x2. The patient tolerated the procedure well and was taken to the recovery room in stable condition.   ____________________________ Stoney Bang. Georgianne Fick, MD ams:sb D: 06/07/2014 12:00:17 ET T: 06/07/2014 13:36:50 ET JOB#: [id number]  cc: Stoney Bang. Georgianne Fick, MD, <Dictator> Dorthula Nettles MD ELECTRONICALLY SIGNED 06/15/2014 22:44

## 2014-07-04 ENCOUNTER — Other Ambulatory Visit: Payer: Medicaid Other

## 2014-07-05 ENCOUNTER — Inpatient Hospital Stay: Admit: 2014-07-05 | Payer: Self-pay | Admitting: Obstetrics and Gynecology

## 2014-07-05 SURGERY — Surgical Case
Anesthesia: Epidural

## 2014-07-05 NOTE — H&P (Signed)
L&D Evaluation:  History Expanded:  HPI 34 yo G3P0111 at [redacted]w[redacted]d gestational age by LMP consistent with 7 week ultrasound.  Pregnancy complicated by obesity (BMI 39), history of prior cesarean delivery, anemia, history of preterm birth, chronic hypertension.  For her chronic hypertension she has been taking labetalol 100mg  po bid.  She has been followed with NSTs and AFIs weekly.  Her most recent growth scan was 1 week ago and the growth percentile was the 23rd.  She presents with two days of elevated blood pressures.  She has been getting systolic readings of about 180 at home. She normally does not have pressures this high.  She also has a headache for which she took one extra strenght tylenol yesterday morning and this did not help.  She notes that she sees dark spots in her visual fields and has had epigastric pain.  She notes shortness of breath also. She states the fetus is moving well, she has had some back pain, but is unsure of contractions.  Denies leakage of fluid and vaginal bleeding. She has also had edema, especially in her legs.   Blood Type (Maternal) A positive   Group B Strep Results Maternal (Result >5wks must be treated as unknown) unknown/result > 5 weeks ago   Maternal HIV Negative   Maternal Syphilis Ab Nonreactive   Maternal Varicella Immune   Rubella Results (Maternal) immune   EDC 11-Jul-2014   Patient's Medical History 1) Obesity, 2) chronic hypertension   Patient's Surgical History D&C  Previous C-Section   Medications 1) Fusiojn plus, 2) ASA 81mg , 3) labetalol 100mg  po bid   Allergies NKDA   Social History none  former tobacco user, denies EtOH and illicit drug use   Family History Non-Contributory   ROS:  ROS All systems were reviewed.  HEENT, CNS, GI, GU, Respiratory, CV, Renal and Musculoskeletal systems were found to be normal., negative unless noted in HPI   Exam:  Vital Signs BPs 158-179/84-108, P80-90s.    General no apparent distress    Mental Status clear    Chest clear    Heart normal sinus rhythm   Abdomen gravid, non-tender   Fetal Position ceph   Back no CVAT   Edema 1+    Reflexes 2+    Mebranes Intact   FHT 145/mod var/+accels/no decels   Ucx irritability   Skin no lesions   Other BSUS: AFI = 8.8cm, fetus ceph, spine maternal right   Impression:  Impression 1) Intrauterine pregnancy at [redacted]w[redacted]d gestational age, 2) chronic hypertension with newly elevated blood pressure   Plan:  Comments 1) chronic hypertension with newly elevated blood pressure: Assess for superimposed preeclampsia. has some vague symptoms that could be consistent with severe features.  However, she does have elevated BPs. WIll attempt to gain control of BPs and see if symptoms resolve. HELLP/Preeclampsia labs -some back, others pending.  Anemic at baseline. usually has normal platelets.  2) Fetus: reassuring at this time  3) method of delivery: cesarean delivery if indicated.   Labs:  Lab Results:  Routine UA:  09-Apr-16 00:21   Protein (UA) 30 mg/dL  Routine Hem:  09-Apr-16 00:21   RBC (CBC)  3.75  Hemoglobin (CBC)  7.9  Hematocrit (CBC)  25.2  Platelet Count (CBC) 211 (Result(s) reported on 04 Jun 2014 at 12:31AM.)   Electronic Signatures: Will Bonnet (MD)  (Signed 09-Apr-16 00:47)  Authored: L&D Evaluation, Labs   Last Updated: 09-Apr-16 00:47 by Will Bonnet (MD)

## 2014-07-20 ENCOUNTER — Encounter: Payer: Self-pay | Admitting: Emergency Medicine

## 2014-07-20 ENCOUNTER — Emergency Department: Payer: 59

## 2014-07-20 ENCOUNTER — Other Ambulatory Visit: Payer: Self-pay

## 2014-07-20 ENCOUNTER — Emergency Department
Admission: EM | Admit: 2014-07-20 | Discharge: 2014-07-21 | Disposition: A | Discharge status: Home / Self Care | Payer: 59 | Attending: Emergency Medicine | Admitting: Emergency Medicine

## 2014-07-20 DIAGNOSIS — R079 Chest pain, unspecified: Secondary | ICD-10-CM

## 2014-07-20 DIAGNOSIS — Z720 Tobacco use: Secondary | ICD-10-CM | POA: Diagnosis not present

## 2014-07-20 DIAGNOSIS — Z3202 Encounter for pregnancy test, result negative: Secondary | ICD-10-CM | POA: Insufficient documentation

## 2014-07-20 DIAGNOSIS — R51 Headache: Secondary | ICD-10-CM | POA: Diagnosis not present

## 2014-07-20 DIAGNOSIS — R519 Headache, unspecified: Secondary | ICD-10-CM

## 2014-07-20 DIAGNOSIS — I159 Secondary hypertension, unspecified: Secondary | ICD-10-CM | POA: Insufficient documentation

## 2014-07-20 DIAGNOSIS — I10 Essential (primary) hypertension: Secondary | ICD-10-CM | POA: Diagnosis present

## 2014-07-20 DIAGNOSIS — Z79899 Other long term (current) drug therapy: Secondary | ICD-10-CM | POA: Insufficient documentation

## 2014-07-20 LAB — URINALYSIS COMPLETE WITH MICROSCOPIC (ARMC ONLY)
BILIRUBIN URINE: NEGATIVE
Glucose, UA: NEGATIVE mg/dL
KETONES UR: NEGATIVE mg/dL
Nitrite: NEGATIVE
PROTEIN: NEGATIVE mg/dL
Specific Gravity, Urine: 1.008 (ref 1.005–1.030)
pH: 8 (ref 5.0–8.0)

## 2014-07-20 LAB — BASIC METABOLIC PANEL
Anion gap: 9 (ref 5–15)
BUN: 6 mg/dL (ref 6–20)
CO2: 26 mmol/L (ref 22–32)
Calcium: 9.4 mg/dL (ref 8.9–10.3)
Chloride: 104 mmol/L (ref 101–111)
Creatinine, Ser: 0.63 mg/dL (ref 0.44–1.00)
GLUCOSE: 101 mg/dL — AB (ref 65–99)
POTASSIUM: 3.1 mmol/L — AB (ref 3.5–5.1)
Sodium: 139 mmol/L (ref 135–145)

## 2014-07-20 LAB — CBC
HEMATOCRIT: 34.6 % — AB (ref 35.0–47.0)
HEMOGLOBIN: 10.8 g/dL — AB (ref 12.0–16.0)
MCH: 22.7 pg — ABNORMAL LOW (ref 26.0–34.0)
MCHC: 31.3 g/dL — ABNORMAL LOW (ref 32.0–36.0)
MCV: 72.6 fL — AB (ref 80.0–100.0)
Platelets: 289 10*3/uL (ref 150–440)
RBC: 4.77 MIL/uL (ref 3.80–5.20)
RDW: 22.1 % — AB (ref 11.5–14.5)
WBC: 6.1 10*3/uL (ref 3.6–11.0)

## 2014-07-20 LAB — HEPATIC FUNCTION PANEL
ALT: 15 U/L (ref 14–54)
AST: 20 U/L (ref 15–41)
Albumin: 4.3 g/dL (ref 3.5–5.0)
Alkaline Phosphatase: 94 U/L (ref 38–126)
BILIRUBIN TOTAL: 0.1 mg/dL — AB (ref 0.3–1.2)
Bilirubin, Direct: <0.1 mg/dL — ABNORMAL LOW (ref 0.1–0.5)
Total Protein: 8.1 g/dL (ref 6.5–8.1)

## 2014-07-20 LAB — TROPONIN I

## 2014-07-20 MED ORDER — HYDRALAZINE HCL 20 MG/ML IJ SOLN
5.0000 mg | Freq: Once | INTRAMUSCULAR | Status: AC
  Administered 2014-07-20: 5 mg via INTRAVENOUS

## 2014-07-20 MED ORDER — MAGNESIUM SULFATE 2 GM/50ML IV SOLN
2.0000 g | Freq: Once | INTRAVENOUS | Status: AC
  Administered 2014-07-20: 2 g via INTRAVENOUS

## 2014-07-20 MED ORDER — KETOROLAC TROMETHAMINE 30 MG/ML IJ SOLN
30.0000 mg | Freq: Once | INTRAMUSCULAR | Status: AC
  Administered 2014-07-20: 30 mg via INTRAVENOUS

## 2014-07-20 MED ORDER — ACETAMINOPHEN 500 MG PO TABS
1000.0000 mg | ORAL_TABLET | Freq: Once | ORAL | Status: AC
  Administered 2014-07-20: 1000 mg via ORAL

## 2014-07-20 MED ORDER — MAGNESIUM SULFATE 2 GM/50ML IV SOLN
INTRAVENOUS | Status: AC
  Administered 2014-07-20: 2 g via INTRAVENOUS
  Filled 2014-07-20: qty 50

## 2014-07-20 MED ORDER — ACETAMINOPHEN 500 MG PO TABS
ORAL_TABLET | ORAL | Status: AC
  Administered 2014-07-20: 1000 mg via ORAL
  Filled 2014-07-20: qty 2

## 2014-07-20 MED ORDER — KETOROLAC TROMETHAMINE 30 MG/ML IJ SOLN
INTRAMUSCULAR | Status: AC
  Administered 2014-07-20: 30 mg via INTRAVENOUS
  Filled 2014-07-20: qty 3

## 2014-07-20 MED ORDER — HYDRALAZINE HCL 20 MG/ML IJ SOLN
INTRAMUSCULAR | Status: AC
  Administered 2014-07-20: 5 mg via INTRAVENOUS
  Filled 2014-07-20: qty 1

## 2014-07-20 NOTE — ED Notes (Addendum)
Pt had emergency C-section last month for HTN.  Was checking BP today and reports has been elevated all day today.  Also c/o HA to right temporal area.  Has also has discomfort to chest

## 2014-07-20 NOTE — ED Provider Notes (Deleted)
[facility] Emergency Department Provider Note  ____________________________________________  Time seen: Approximately 950 PM  I have reviewed the triage vital signs and the nursing notes.   HISTORY  Chief Complaint Hypertension    HPI GAYLON MELCHOR is a 34 y.o. female with a history of hypertension and preeclampsia presents today with high blood pressure. In April 9 the patient had an emergency C-section at 34 weeks due to her preeclampsia. She was on labetalol postpartum and then switched to lisinopril 10 mg this past Monday at her six-week follow-up appointment. She is complaining of some intermittent headache and chest discomfort. She says that she had hypertension prior to her pregnancy and was on 10 mg of lisinopril. She had a previous pregnancy which was also, located by preeclampsia. She came in today because she took her blood pressure, which was elevated. She says that previously her blood pressures never been this high but is now in the 998P and 382N systolic. She says the chest discomfort is across her chest and nonradiating. It is been constant for the past 1-2 days.   Past Medical History  Diagnosis Date  . Hypertension     There are no active problems to display for this patient.   Past Surgical History  Procedure Laterality Date  . Tonsillectomy    . C-section  2004    Current Outpatient Rx  Name  Route  Sig  Dispense  Refill  . acetaminophen (TYLENOL) 325 MG tablet   Oral   Take 650 mg by mouth once.         Marland Kitchen lisinopril (PRINIVIL,ZESTRIL) 10 MG tablet   Oral   Take 10 mg by mouth daily.         . diphenhydrAMINE (BENADRYL) 25 MG tablet   Oral   Take 1 tablet (25 mg total) by mouth every 6 (six) hours.   20 tablet   0   . hydrocortisone cream 1 %   Topical   Apply topically 2 (two) times daily.   20 g   0   . labetalol (NORMODYNE) 200 MG tablet   Oral   Take 1 tablet (200 mg total) by mouth 2 (two) times  daily.   60 tablet   0   . ranitidine (ZANTAC) 75 MG tablet   Oral   Take 75 mg by mouth daily as needed for heartburn (Used for bloating and indigestion.).           Allergies Review of patient's allergies indicates no known allergies.  History reviewed. No pertinent family history.  Social History History  Substance Use Topics  . Smoking status: Current Every Day Smoker  . Smokeless tobacco: Not on file  . Alcohol Use: No    Review of Systems Constitutional: No fever/chills Eyes: No visual changes. ENT: No sore throat. Cardiovascular: As above Respiratory: Denies shortness of breath. Gastrointestinal: No abdominal pain.  No nausea, no vomiting.  No diarrhea.  No constipation. Genitourinary: Negative for dysuria. Musculoskeletal: Negative for back pain. Skin: Negative for rash. Neurological: Negative for headaches, focal weakness or numbness.  10-point ROS otherwise negative.  ____________________________________________   PHYSICAL EXAM:  VITAL SIGNS: ED Triage Vitals  Enc Vitals Group     BP 07/20/14 2005 190/120 mmHg     Pulse Rate 07/20/14 2005 89     Resp 07/20/14 2005 18     Temp 07/20/14 2005 98.2 F (36.8 C)     Temp Source 07/20/14 2005 Oral  SpO2 07/20/14 2005 100 %     Weight 07/20/14 2005 200 lb (90.719 kg)     Height 07/20/14 2005 5\' 1"  (1.549 m)     Head Cir --      Peak Flow --      Pain Score 07/20/14 2006 5     Pain Loc --      Pain Edu? --      Excl. in [place]? --     Constitutional: Alert and oriented. Well appearing and in no acute distress. Eyes: Conjunctivae are normal. PERRL. EOMI. Head: Atraumatic. Nose: No congestion/rhinnorhea. Mouth/Throat: Mucous membranes are moist.  Oropharynx non-erythematous. Neck: No stridor.   Cardiovascular: Normal rate, regular rhythm. Grossly normal heart sounds.  Good peripheral circulation. Respiratory: Normal respiratory effort.  No retractions. Lungs CTAB. Gastrointestinal: Soft and  nontender. No distention. No abdominal bruits. No CVA tenderness. Musculoskeletal: No lower extremity tenderness nor edema.  No joint effusions. Neurologic:  Normal speech and language. No gross focal neurologic deficits are appreciated. Speech is normal. No gait instability. Skin:  Skin is warm, dry and intact. No rash noted. Psychiatric: Mood and affect are normal. Speech and behavior are normal.  ____________________________________________   LABS (all labs ordered are listed, but only abnormal results are displayed)  Labs Reviewed  BASIC METABOLIC PANEL - Abnormal; Notable for the following:    Potassium 3.1 (*)    Glucose, Bld 101 (*)    All other components within normal limits  CBC - Abnormal; Notable for the following:    Hemoglobin 10.8 (*)    HCT 34.6 (*)    MCV 72.6 (*)    MCH 22.7 (*)    MCHC 31.3 (*)    RDW 22.1 (*)    All other components within normal limits  HEPATIC FUNCTION PANEL - Abnormal; Notable for the following:    Total Bilirubin 0.1 (*)    Bilirubin, Direct <0.1 (*)    All other components within normal limits  TROPONIN I  URINALYSIS COMPLETEWITH MICROSCOPIC ([facility] ONLY)  POC URINE PREG, ED   ____________________________________________  EKG  ED ECG REPORT I, Schaevitz,  Youlanda Roys, the attending physician, personally viewed and interpreted this ECG.   Date: 07/20/2014  EKG Time: 2014  Rate: 75  Rhythm: Normal sinus rhythm with sinus arrhythmia  Axis: Normal axis  Intervals:none  ST&T Change: No ST elevations or depressions. No abnormal T-wave inversions.  ____________________________________________  RADIOLOGY  Pending CT of the head and chest. Chest x-ray with no active cardiopulmonary disease. ____________________________________________   PROCEDURES    ____________________________________________   INITIAL IMPRESSION / ASSESSMENT AND PLAN / ED COURSE  Pertinent labs & imaging results that were available during my care of the  patient were reviewed by me and considered in my medical decision making (see chart for details).  ----------------------------------------- 11:43 PM on 07/20/2014 -----------------------------------------  Pending CAT scan of the head as well as CT of the chest. Dr. Owens Shark to follow-up with imaging and disposition accordingly. ____________________________________________   FINAL CLINICAL IMPRESSION(S) / ED DIAGNOSES  Chest pain. Numbness, resolved to the right upper and right lower extremities. Acute, return visit.    Orbie Pyo, MD 07/20/14 [phone]

## 2014-07-20 NOTE — ED Provider Notes (Signed)
[facility] Emergency Department Provider Note  ____________________________________________  Time seen: Approximately 950 PM  I have reviewed the triage vital signs and the nursing notes.   HISTORY  Chief Complaint Hypertension    HPI Theresa Bowen is a 34 y.o. female with a history of hypertension and preeclampsia presents today with high blood pressure. In April 9 the patient had an emergency C-section at 34 weeks due to her preeclampsia. She was on labetalol postpartum and then switched to lisinopril 10 mg this past Monday at her six-week follow-up appointment. She is complaining of some intermittent headache and chest discomfort. She says that she had hypertension prior to her pregnancy and was on 10 mg of lisinopril. She had a previous pregnancy which was also, located by preeclampsia. She came in today because she took her blood pressure, which was elevated. She says that previously her blood pressures never been this high but is now in the 443X and 540G systolic. She says the chest discomfort is across her chest and nonradiating. It is been constant for the past 1-2 days.   Past Medical History  Diagnosis Date  . Hypertension     There are no active problems to display for this patient.   Past Surgical History  Procedure Laterality Date  . Tonsillectomy    . C-section  2004    Current Outpatient Rx  Name  Route  Sig  Dispense  Refill  . acetaminophen (TYLENOL) 325 MG tablet   Oral   Take 650 mg by mouth once.         Marland Kitchen lisinopril (PRINIVIL,ZESTRIL) 10 MG tablet   Oral   Take 10 mg by mouth daily.         . diphenhydrAMINE (BENADRYL) 25 MG tablet   Oral   Take 1 tablet (25 mg total) by mouth every 6 (six) hours.   20 tablet   0   . hydrocortisone cream 1 %   Topical   Apply topically 2 (two) times daily.   20 g   0   . labetalol (NORMODYNE) 200  MG tablet   Oral   Take 1 tablet (200 mg total) by mouth 2 (two) times daily.   60 tablet   0   . ranitidine (ZANTAC) 75 MG tablet   Oral   Take 75 mg by mouth daily as needed for heartburn (Used for bloating and indigestion.).           Allergies Review of patient's allergies indicates no known allergies.  History reviewed. No pertinent family history.  Social History History  Substance Use Topics  . Smoking status: Current Every Day Smoker  . Smokeless tobacco: Not on file  . Alcohol Use: No    Review of Systems Constitutional: No fever/chills Eyes: No visual changes. ENT: No sore throat. Cardiovascular: As above Respiratory: Denies shortness of breath. Gastrointestinal: No abdominal pain. No nausea, no vomiting. No diarrhea. No constipation. Genitourinary: Negative for dysuria. Musculoskeletal: Negative for back pain. Skin: Negative for rash. Neurological: Negative for headaches, focal weakness or numbness.  10-point ROS otherwise negative.  ____________________________________________   PHYSICAL EXAM:  VITAL SIGNS: ED Triage Vitals  Enc Vitals Group   BP 07/20/14 2005 190/120 mmHg   Pulse Rate 07/20/14 2005 89   Resp 07/20/14 2005 18   Temp 07/20/14 2005 98.2 F (36.8 C)   Temp Source 07/20/14 2005 Oral   SpO2 07/20/14 2005 100 %   Weight 07/20/14 2005 200 lb (90.719 kg)  Height 07/20/14 2005 5\' 1"  (1.549 m)   Head Cir --    Peak Flow --    Pain Score 07/20/14 2006 5   Pain Loc --    Pain Edu? --    Excl. in [place]? --     Constitutional: Alert and oriented. Well appearing and in no acute distress. Eyes: Conjunctivae are normal. PERRL. EOMI. Head: Atraumatic. Nose: No congestion/rhinnorhea. Mouth/Throat: Mucous membranes are moist. Oropharynx non-erythematous. Neck: No stridor.  Cardiovascular: Normal rate, regular rhythm. Grossly normal  heart sounds. Good peripheral circulation. Respiratory: Normal respiratory effort. No retractions. Lungs CTAB. Gastrointestinal: Soft and nontender. No distention. No abdominal bruits. No CVA tenderness. Musculoskeletal: No lower extremity tenderness nor edema. No joint effusions. Neurologic: Normal speech and language. No gross focal neurologic deficits are appreciated. Speech is normal. No gait instability. Skin: Skin is warm, dry and intact. No rash noted. Psychiatric: Mood and affect are normal. Speech and behavior are normal.  ____________________________________________  LABS (all labs ordered are listed, but only abnormal results are displayed)  Labs Reviewed  BASIC METABOLIC PANEL - Abnormal; Notable for the following:    Potassium 3.1 (*)    Glucose, Bld 101 (*)    All other components within normal limits  CBC - Abnormal; Notable for the following:    Hemoglobin 10.8 (*)    HCT 34.6 (*)    MCV 72.6 (*)    MCH 22.7 (*)    MCHC 31.3 (*)    RDW 22.1 (*)    All other components within normal limits  HEPATIC FUNCTION PANEL - Abnormal; Notable for the following:    Total Bilirubin 0.1 (*)    Bilirubin, Direct <0.1 (*)    All other components within normal limits  TROPONIN I  URINALYSIS COMPLETEWITH MICROSCOPIC ([facility] ONLY)  POC URINE PREG, ED   ____________________________________________  EKG  ED ECG REPORT I, Schaevitz, Youlanda Roys, the attending physician, personally viewed and interpreted this ECG.  Date: 07/20/2014 EKG Time: 2014 Rate: 75 Rhythm: Normal sinus rhythm with sinus arrhythmia Axis: Normal axis Intervals:none ST&T Change: No ST elevations or depressions. No abnormal T-wave inversions.  ____________________________________________  RADIOLOGY   Chest x-ray with no active cardiopulmonary  disease. ____________________________________________   PROCEDURES    ____________________________________________   INITIAL IMPRESSION / ASSESSMENT AND PLAN / ED COURSE  Pertinent labs & imaging results that were available during my care of the patient were reviewed by me and considered in my medical decision making (see chart for details).  ----------------------------------------- 11:43 PM on 07/20/2014 -----------------------------------------  Dr. Owens Shark to reassess after antihypertensive meds. Labs reassuring including no protein in the urine as well as normal liver function tests and no low platelets. Case discussed with Dr. Ilda Basset of OB/GYN. Unlikely that this represents preeclampsia greater than 6 weeks out from the patient's C-section. Likely is primary hypertension. Likely to just put a home as long as symptoms improve and blood pressure comes down. ____________________________________________   FINAL CLINICAL IMPRESSION(S) / ED DIAGNOSES  Headache, chest pain, hypertension. Acute, initial visit.           Orbie Pyo, MD 07/20/14 [phone]

## 2014-07-20 NOTE — ED Notes (Signed)
Negative pregnancy test per POCT.

## 2014-07-21 MED ORDER — LORAZEPAM 2 MG/ML IJ SOLN
1.0000 mg | Freq: Once | INTRAMUSCULAR | Status: AC
  Administered 2014-07-21: 1 mg via INTRAVENOUS

## 2014-07-21 MED ORDER — LISINOPRIL 10 MG PO TABS: 20.0000 mg | ORAL_TABLET | Freq: Every day | ORAL | Status: DC

## 2014-07-21 MED ORDER — CLONIDINE HCL 0.1 MG PO TABS
ORAL_TABLET | ORAL | Status: AC
  Administered 2014-07-21: 0.1 mg via ORAL
  Filled 2014-07-21: qty 1

## 2014-07-21 MED ORDER — CLONIDINE HCL 0.1 MG PO TABS
0.1000 mg | ORAL_TABLET | Freq: Once | ORAL | Status: AC
  Administered 2014-07-21: 0.1 mg via ORAL

## 2014-07-21 MED ORDER — LORAZEPAM 2 MG/ML IJ SOLN
INTRAMUSCULAR | Status: AC
  Administered 2014-07-21: 1 mg via INTRAVENOUS
  Filled 2014-07-21: qty 1

## 2014-07-21 NOTE — ED Notes (Signed)
MD at bedside. 

## 2014-07-21 NOTE — Discharge Instructions (Signed)

## 2014-07-22 ENCOUNTER — Encounter: Payer: Self-pay | Admitting: Internal Medicine

## 2014-07-22 DIAGNOSIS — M545 Low back pain, unspecified: Secondary | ICD-10-CM | POA: Insufficient documentation

## 2014-07-22 DIAGNOSIS — R946 Abnormal results of thyroid function studies: Secondary | ICD-10-CM | POA: Insufficient documentation

## 2014-07-22 DIAGNOSIS — I10 Essential (primary) hypertension: Secondary | ICD-10-CM | POA: Insufficient documentation

## 2014-07-22 DIAGNOSIS — F419 Anxiety disorder, unspecified: Secondary | ICD-10-CM | POA: Insufficient documentation

## 2014-07-22 DIAGNOSIS — R600 Localized edema: Secondary | ICD-10-CM | POA: Insufficient documentation

## 2014-07-22 DIAGNOSIS — D509 Iron deficiency anemia, unspecified: Secondary | ICD-10-CM | POA: Insufficient documentation

## 2014-07-22 HISTORY — DX: Abnormal results of thyroid function studies: R94.6

## 2014-07-22 HISTORY — DX: Iron deficiency anemia, unspecified: D50.9

## 2014-11-14 ENCOUNTER — Other Ambulatory Visit: Payer: Self-pay | Admitting: Internal Medicine

## 2014-11-17 ENCOUNTER — Ambulatory Visit (INDEPENDENT_AMBULATORY_CARE_PROVIDER_SITE_OTHER): Payer: 59 | Admitting: Internal Medicine

## 2014-11-17 ENCOUNTER — Encounter: Payer: Self-pay | Admitting: Internal Medicine

## 2014-11-17 VITALS — BP 100/64 | HR 64 | Ht 61.0 in | Wt 191.0 lb

## 2014-11-17 DIAGNOSIS — Z23 Encounter for immunization: Secondary | ICD-10-CM

## 2014-11-17 DIAGNOSIS — I10 Essential (primary) hypertension: Secondary | ICD-10-CM

## 2014-11-17 MED ORDER — LISINOPRIL-HYDROCHLOROTHIAZIDE 20-12.5 MG PO TABS: 1.0000 | ORAL_TABLET | Freq: Every day | ORAL | Status: DC

## 2014-11-17 NOTE — Progress Notes (Signed)
Date:  11/17/2014   Name:  Theresa Bowen   DOB:  Nov 15, 1980   MRN:  [id number]   Chief Complaint: Hypertension Hypertension This is a chronic problem. The current episode started more than 1 year ago. The problem has been gradually improving since onset. The problem is controlled. Pertinent negatives include no chest pain, headaches, palpitations or shortness of breath. Past treatments include ACE inhibitors.   she feels well. She's taking the lisinopril HCT daily and denies any side effects such as headache dizziness cough or muscle cramps. Since her baby was born 69 months ago she's gotten off of most of her medication and feels well.   Review of Systems:  Review of Systems  Constitutional: Positive for fatigue (decreased sleep with a new baby). Negative for fever and chills.  Respiratory: Negative for cough and shortness of breath.   Cardiovascular: Negative for chest pain, palpitations and leg swelling.  Musculoskeletal: Negative for myalgias.  Neurological: Negative for tremors, light-headedness and headaches.    Patient Active Problem List   Diagnosis Date Noted  . Anxiety 07/22/2014  . Abnormal finding on thyroid function test 07/22/2014  . Essential (primary) hypertension 07/22/2014  . Anemia, iron deficiency 07/22/2014  . Local edema 07/22/2014  . LBP (low back pain) 07/22/2014    Prior to Admission medications   Medication Sig Start Date End Date Taking? Authorizing Provider  acetaminophen (TYLENOL) 325 MG tablet Take 650 mg by mouth once.   Yes Historical Provider, MD  lisinopril-hydrochlorothiazide Reita May) 20-12.5 MG per tablet take 1 tablet by mouth once daily 11/14/14  Yes Glean Hess, MD    No Known Allergies  Past Surgical History  Procedure Laterality Date  . Tonsillectomy    . C-section  2004    Social History  Substance Use Topics  . Smoking status: Current Every Day Smoker  . Smokeless tobacco: None  . Alcohol Use: No      Medication list has been reviewed and updated.  Physical Examination:  Physical Exam  Constitutional: She is oriented to person, place, and time. She appears well-developed and well-nourished.  Neck: Normal range of motion. Neck supple. No thyromegaly present.  Cardiovascular: Normal rate, regular rhythm and normal heart sounds.   Pulmonary/Chest: She is in respiratory distress. She has wheezes.  Musculoskeletal: She exhibits edema and tenderness.  Neurological: She is alert and oriented to person, place, and time. She has normal reflexes.  Psychiatric: She has a normal mood and affect.  Nursing note and vitals reviewed.   BP 100/64 mmHg  Pulse 64  Ht 5\' 1"  (1.549 m)  Wt 191 lb (86.637 kg)  BMI 36.11 kg/m2  Assessment and Plan: 1. Flu vaccine need - Flu Vaccine QUAD 36+ mos PF IM (Fluarix & Fluzone Quad PF)  2. Essential (primary) hypertension Excellent control on current medications; continue current dose Follow-up in 1 year and as needed - lisinopril-hydrochlorothiazide (PRINZIDE,ZESTORETIC) 20-12.5 MG per tablet; Take 1 tablet by mouth daily.  Dispense: 90 tablet; Refill: [place], MD [facility]  11/17/2014

## 2015-02-23 ENCOUNTER — Encounter: Payer: Self-pay | Admitting: Internal Medicine

## 2015-02-23 ENCOUNTER — Ambulatory Visit (INDEPENDENT_AMBULATORY_CARE_PROVIDER_SITE_OTHER): Payer: 59 | Admitting: Internal Medicine

## 2015-02-23 VITALS — BP 110/70 | HR 80 | Ht 61.0 in | Wt 195.8 lb

## 2015-02-23 DIAGNOSIS — N6012 Diffuse cystic mastopathy of left breast: Secondary | ICD-10-CM

## 2015-02-23 DIAGNOSIS — M6248 Contracture of muscle, other site: Secondary | ICD-10-CM

## 2015-02-23 DIAGNOSIS — M62838 Other muscle spasm: Secondary | ICD-10-CM

## 2015-02-23 MED ORDER — METHOCARBAMOL 500 MG PO TABS: 500.0000 mg | ORAL_TABLET | Freq: Two times a day (BID) | ORAL | Status: DC | PRN

## 2015-02-23 NOTE — Progress Notes (Signed)
Date:  02/23/2015   Name:  Theresa Bowen   DOB:  February 14, 1981   MRN:  [id number]   Chief Complaint: Breast Mass Neck Pain  This is a chronic problem. The current episode started more than 1 month ago. The problem occurs daily. The problem has been unchanged. The pain is associated with lifting a heavy object (carrying her 50 lb 55 month old on her left hip). Pertinent negatives include no chest pain or fever. She has tried acetaminophen for the symptoms.   Breast lump - 2 days ago she noticed a fullness in her left outer breast on routine exam. The area was slightly tender. She denies any skin change or nipple discharge. She denies trauma. She is not breast-feeding. Yesterday the area seemed less tender and less prominent.   Review of Systems  Constitutional: Negative for fever and chills.  Respiratory: Negative for shortness of breath.   Cardiovascular: Negative for chest pain.  Musculoskeletal: Positive for neck pain.  Skin: Negative for color change and rash.    Patient Active Problem List   Diagnosis Date Noted  . Anxiety 07/22/2014  . Abnormal finding on thyroid function test 07/22/2014  . Essential (primary) hypertension 07/22/2014  . Anemia, iron deficiency 07/22/2014  . Local edema 07/22/2014  . LBP (low back pain) 07/22/2014    Prior to Admission medications   Medication Sig Start Date End Date Taking? Authorizing Provider  acetaminophen (TYLENOL) 325 MG tablet Take 650 mg by mouth once.   Yes Historical Provider, MD  lisinopril-hydrochlorothiazide (PRINZIDE,ZESTORETIC) 20-12.5 MG per tablet Take 1 tablet by mouth daily. 11/17/14  Yes Glean Hess, MD    No Known Allergies  Past Surgical History  Procedure Laterality Date  . Tonsillectomy    . C-section  2004    Social History  Substance Use Topics  . Smoking status: Current Every Day Smoker  . Smokeless tobacco: None  . Alcohol Use: No     Medication list has been reviewed and  updated.   Physical Exam  Constitutional: She is oriented to person, place, and time. She appears well-developed. No distress.  HENT:  Head: Normocephalic and atraumatic.  Eyes: Conjunctivae are normal. Right eye exhibits no discharge. Left eye exhibits no discharge. No scleral icterus.  Cardiovascular: Normal rate and regular rhythm.   Pulmonary/Chest: Effort normal and breath sounds normal. No respiratory distress. Right breast exhibits no mass, no nipple discharge, no skin change and no tenderness. Left breast exhibits no nipple discharge and no skin change.    Musculoskeletal: Normal range of motion.       Cervical back: She exhibits tenderness and spasm.  Neurological: She is alert and oriented to person, place, and time.  Skin: Skin is warm and dry. No rash noted.  Psychiatric: She has a normal mood and affect. Her behavior is normal. Thought content normal.    BP 110/70 mmHg  Pulse 80  Ht 5\' 1"  (1.549 m)  Wt 195 lb 12.8 oz (88.814 kg)  BMI 37.02 kg/m2  Assessment and Plan: 1. Muscle spasms of neck Use ice or heat and Tylenol Make adjustments to how she carries her child to avoid unequal strain on the neck and back  methocarbamol (ROBAXIN) 500 MG tablet; Take 1 tablet (500 mg total) by mouth 2 (two) times daily as needed for muscle spasms.  Dispense: 60 tablet; Refill: 2  2. Fibrocystic breast changes Reduce caffeine intake monitor area until after her next menstrual cycle and if persistent return  for follow-up  Halina Maidens, MD [facility]  02/23/2015

## 2015-04-21 ENCOUNTER — Ambulatory Visit (INDEPENDENT_AMBULATORY_CARE_PROVIDER_SITE_OTHER): Payer: 59 | Admitting: Internal Medicine

## 2015-04-21 ENCOUNTER — Encounter: Payer: Self-pay | Admitting: Internal Medicine

## 2015-04-21 VITALS — BP 128/84 | HR 91 | Temp 98.2°F | Resp 16 | Ht 61.0 in | Wt 202.4 lb

## 2015-04-21 DIAGNOSIS — J0100 Acute maxillary sinusitis, unspecified: Secondary | ICD-10-CM

## 2015-04-21 MED ORDER — AMOXICILLIN-POT CLAVULANATE 875-125 MG PO TABS: 1.0000 | ORAL_TABLET | Freq: Two times a day (BID) | ORAL | Status: DC

## 2015-04-21 NOTE — Progress Notes (Signed)
    Date:  04/21/2015   Name:  Theresa Bowen   DOB:  [birth year]   MRN:  [id number]   Chief Complaint: Sinus Problem Sinus Problem This is a new problem. The current episode started in the past 7 days. The problem is unchanged. There has been no fever. Associated symptoms include congestion, coughing, headaches, sinus pressure and a sore throat. Pertinent negatives include no chills, diaphoresis, ear pain, neck pain, shortness of breath or swollen glands. Past treatments include acetaminophen. The treatment provided mild relief.     Review of Systems  Constitutional: Negative for fever, chills, diaphoresis and fatigue.  HENT: Positive for congestion, postnasal drip, rhinorrhea, sinus pressure and sore throat. Negative for ear pain and hearing loss.   Eyes: Negative for visual disturbance.  Respiratory: Positive for cough. Negative for chest tightness, shortness of breath and wheezing.   Cardiovascular: Negative for chest pain and palpitations.  Gastrointestinal: Negative for nausea, abdominal pain and diarrhea.  Musculoskeletal: Negative for neck pain.  Neurological: Positive for headaches. Negative for dizziness.    Patient Active Problem List   Diagnosis Date Noted  . Anxiety 07/22/2014  . Abnormal finding on thyroid function test 07/22/2014  . Essential (primary) hypertension 07/22/2014  . Anemia, iron deficiency 07/22/2014  . Local edema 07/22/2014  . LBP (low back pain) 07/22/2014    Prior to Admission medications   Medication Sig Start Date End Date Taking? Authorizing Provider  acetaminophen (TYLENOL) 325 MG tablet Take 650 mg by mouth once.   Yes Historical Provider, MD  lisinopril-hydrochlorothiazide (PRINZIDE,ZESTORETIC) 20-12.5 MG per tablet Take 1 tablet by mouth daily. 11/17/14  Yes Glean Hess, MD    No Known Allergies  Past Surgical History  Procedure Laterality Date  . Tonsillectomy    . C-section  2004    Social History  Substance Use Topics  .  Smoking status: Current Every Day Smoker -- 0.50 packs/day    Types: Cigarettes  . Smokeless tobacco: None  . Alcohol Use: No     Medication list has been reviewed and updated.   Physical Exam  Constitutional: She is oriented to person, place, and time. She appears well-developed and well-nourished.  HENT:  Right Ear: Tympanic membrane is not erythematous and not retracted.  Nose: Right sinus exhibits maxillary sinus tenderness and frontal sinus tenderness. Left sinus exhibits maxillary sinus tenderness and frontal sinus tenderness.  Mouth/Throat: Uvula is midline and mucous membranes are normal. No oral lesions. Posterior oropharyngeal erythema present. No oropharyngeal exudate.  Neck: Normal range of motion. Neck supple.  Cardiovascular: Normal rate, regular rhythm and normal heart sounds.   Pulmonary/Chest: Effort normal and breath sounds normal. She has no wheezes. She has no rales.  Abdominal: Bowel sounds are normal.  Musculoskeletal: Normal range of motion. She exhibits no edema.  Lymphadenopathy:    She has no cervical adenopathy.  Neurological: She is alert and oriented to person, place, and time.    BP 128/84 mmHg  Pulse 91  Temp(Src) 98.2 F (36.8 C) (Oral)  Resp 16  Ht 5\' 1"  (1.549 m)  Wt 202 lb 6.4 oz (91.808 kg)  BMI 38.26 kg/m2  SpO2 100%  Assessment and Plan: 1. Acute maxillary sinusitis, recurrence not specified Increase fluids Take mucinex and use Delsym for cough - amoxicillin-clavulanate (AUGMENTIN) 875-125 MG tablet; Take 1 tablet by mouth 2 (two) times daily.  Dispense: 20 tablet; Refill: 0   Halina Maidens, MD [facility]  04/21/2015

## 2015-04-21 NOTE — Patient Instructions (Signed)

## 2015-11-17 ENCOUNTER — Other Ambulatory Visit: Payer: Self-pay | Admitting: Internal Medicine

## 2015-11-17 DIAGNOSIS — I10 Essential (primary) hypertension: Secondary | ICD-10-CM

## 2015-11-17 MED ORDER — LISINOPRIL-HYDROCHLOROTHIAZIDE 20-12.5 MG PO TABS: 1.0000 | ORAL_TABLET | Freq: Every day | ORAL | 3 refills | Status: DC

## 2016-01-17 ENCOUNTER — Ambulatory Visit (INDEPENDENT_AMBULATORY_CARE_PROVIDER_SITE_OTHER): Payer: 59 | Admitting: Internal Medicine

## 2016-01-17 ENCOUNTER — Encounter: Payer: Self-pay | Admitting: Internal Medicine

## 2016-01-17 VITALS — BP 118/78 | HR 88 | Resp 16 | Ht 61.0 in | Wt 188.0 lb

## 2016-01-17 DIAGNOSIS — R946 Abnormal results of thyroid function studies: Secondary | ICD-10-CM

## 2016-01-17 DIAGNOSIS — D509 Iron deficiency anemia, unspecified: Secondary | ICD-10-CM

## 2016-01-17 DIAGNOSIS — R000 Tachycardia, unspecified: Secondary | ICD-10-CM

## 2016-01-17 DIAGNOSIS — I10 Essential (primary) hypertension: Secondary | ICD-10-CM

## 2016-01-17 MED ORDER — METOPROLOL SUCCINATE ER 25 MG PO TB24: 25.0000 mg | ORAL_TABLET | Freq: Every day | ORAL | 3 refills | Status: DC

## 2016-01-17 NOTE — Progress Notes (Signed)
Date:  01/17/2016   Name:  Theresa Bowen   DOB:  October 29, 1980   MRN:  [id number]   Chief Complaint: Hypertension (Feeling tired and SOB and just feels bad. Feels like fluttering in her heart. ) Hypertension  This is a chronic problem. The current episode started more than 1 year ago. The problem is controlled. Associated symptoms include anxiety, palpitations and shortness of breath. Pertinent negatives include no chest pain or headaches. Past treatments include ACE inhibitors and diuretics.  Shortness of Breath  This is a new problem. The problem occurs constantly. Pertinent negatives include no abdominal pain, chest pain, fever, headaches, leg swelling, vomiting or wheezing. The symptoms are aggravated by smoke. Risk factors include smoking.  Palpitations   This is a new problem. The current episode started more than 1 month ago. The problem occurs every several days. On average, each episode lasts 1 minute. The symptoms are aggravated by caffeine and stress. Associated symptoms include anxiety and shortness of breath. Pertinent negatives include no chest pain, coughing, dizziness, fever, irregular heartbeat, near-syncope, numbness, vomiting or weakness. She has tried nothing for the symptoms. Risk factors include smoking/tobacco exposure and family history. Her past medical history is significant for anemia.  She is under more stress recently.  Just divorced and had some issues.  Raising 2 small children and working full time.   She has had 2 brothers die from either Afib or Stroke due to Afib - both in their 31's.   Review of Systems  Constitutional: Positive for fatigue. Negative for chills, fever and unexpected weight change.  Eyes: Negative for visual disturbance.  Respiratory: Positive for shortness of breath. Negative for cough, choking and wheezing.   Cardiovascular: Positive for palpitations. Negative for chest pain, leg swelling and near-syncope.  Gastrointestinal: Negative for  abdominal pain and vomiting.  Musculoskeletal: Negative for arthralgias, gait problem and joint swelling.  Neurological: Negative for dizziness, syncope, weakness, numbness and headaches.  Psychiatric/Behavioral: The patient is nervous/anxious.     Patient Active Problem List   Diagnosis Date Noted  . Anxiety 07/22/2014  . Abnormal finding on thyroid function test 07/22/2014  . Essential (primary) hypertension 07/22/2014  . Anemia, iron deficiency 07/22/2014  . Local edema 07/22/2014  . LBP (low back pain) 07/22/2014    Prior to Admission medications   Medication Sig Start Date End Date Taking? Authorizing Provider  acetaminophen (TYLENOL) 325 MG tablet Take 650 mg by mouth once.   Yes Historical Provider, MD  lisinopril-hydrochlorothiazide (PRINZIDE,ZESTORETIC) 20-12.5 MG tablet Take 1 tablet by mouth daily. 11/17/15  Yes Glean Hess, MD    No Known Allergies  Past Surgical History:  Procedure Laterality Date  . c-section  2004  . TONSILLECTOMY      Social History  Substance Use Topics  . Smoking status: Current Every Day Smoker    Packs/day: 0.50    Types: Cigarettes  . Smokeless tobacco: Never Used  . Alcohol use No     Medication list has been reviewed and updated.   Physical Exam  Constitutional: She appears well-developed and well-nourished. No distress.  Neck: Carotid bruit is not present. No thyromegaly present.  Cardiovascular: Regular rhythm, normal heart sounds and intact distal pulses.   No extrasystoles are present. Tachycardia present.   Pulmonary/Chest: Breath sounds normal. She has no wheezes. She has no rhonchi.  Abdominal: Soft. Normal appearance and bowel sounds are normal. There is no tenderness.  Psychiatric: Her speech is normal and behavior is normal. Thought  content normal. Her mood appears anxious. Cognition and memory are normal.    BP 118/78   Pulse 88   Resp 16   Ht 5\' 1"  (1.549 m)   Wt 188 lb (85.3 kg)   LMP 12/28/2015   SpO2  100%   BMI 35.52 kg/m   Assessment and Plan: 1. Essential (primary) hypertension Check labs; reduce dose of lisinopril/hct and add metoprolol - Comprehensive metabolic panel  2. Iron deficiency anemia, unspecified iron deficiency anemia type Check labs; begin Women's multi vitamin/mineral with iron daily - CBC with Differential/Platelet  3. Abnormal finding on thyroid function test - TSH  4. Tachycardia Add low dose metoprolol Reduce caffeine intake Reduce general stressors if able Call next week if still symptomatic - EKG 12-Lead - sinus tachycardia @ 101 - metoprolol succinate (TOPROL-XL) 25 MG 24 hr tablet; Take 1 tablet (25 mg total) by mouth daily.  Dispense: 30 tablet; Refill: [place], MD [facility]  01/17/2016

## 2016-03-02 ENCOUNTER — Ambulatory Visit
Admission: EM | Admit: 2016-03-02 | Discharge: 2016-03-02 | Disposition: A | Discharge status: Home / Self Care | Payer: 59 | Attending: Emergency Medicine | Admitting: Emergency Medicine

## 2016-03-02 ENCOUNTER — Encounter: Payer: Self-pay | Admitting: Emergency Medicine

## 2016-03-02 DIAGNOSIS — R69 Illness, unspecified: Secondary | ICD-10-CM

## 2016-03-02 DIAGNOSIS — J111 Influenza due to unidentified influenza virus with other respiratory manifestations: Secondary | ICD-10-CM

## 2016-03-02 LAB — RAPID STREP SCREEN (MED CTR MEBANE ONLY): Streptococcus, Group A Screen (Direct): NEGATIVE

## 2016-03-02 LAB — RAPID INFLUENZA A&B ANTIGENS
Influenza A (ARMC): NEGATIVE
Influenza B (ARMC): NEGATIVE

## 2016-03-02 MED ORDER — BENZONATATE 100 MG PO CAPS: 100.0000 mg | ORAL_CAPSULE | Freq: Three times a day (TID) | ORAL | 0 refills | Status: DC | PRN

## 2016-03-02 MED ORDER — GUAIFENESIN-CODEINE 100-10 MG/5ML PO SOLN: 5.0000 mL | Freq: Every evening | ORAL | 0 refills | Status: DC | PRN

## 2016-03-02 NOTE — ED Provider Notes (Signed)
[facility] ____________________________________________  Time seen: Approximately 11:30 AM  I have reviewed the triage vital signs and the nursing notes.   HISTORY  Chief Complaint Cough; Generalized Body Aches; and Nasal Congestion  HPI Theresa Bowen is a 36 y.o. female presents for complaints of runny nose, nasal congestion, sinus pressure, cough and bodyaches for the last 3-4 days. Patient reports temperature maximum of 99.6 orally at home yesterday. Patient reports has occasionally taken Tylenol. Reports not taking any other medications at home for the same complaints. Denies known sick contacts. Reports nasal congestion causes her to have to mouth breathe. States mild accompanying sore throat. Reports continues to drink fluids well, slight decrease in appetite. States cough mostly is a nonproductive cough. States cough does intermittently interrupt her sleep. Denies wheezing. Patient reports she feels that she has chest congestion.  Denies chest pain, shortness of breath, abdominal pain, dysuria, extremity pain or extremity swelling. Denies recent sickness or recent antibiotic use.  Halina Maidens, MD: PCP Patient's last menstrual period was 02/23/2016 (exact date). Denies pregnancy   Past Medical History:  Diagnosis Date  . Hypertension     Patient Active Problem List   Diagnosis Date Noted  . Anxiety 07/22/2014  . Abnormal finding on thyroid function test 07/22/2014  . Essential (primary) hypertension 07/22/2014  . Anemia, iron deficiency 07/22/2014  . Local edema 07/22/2014  . LBP (low back pain) 07/22/2014    Past Surgical History:  Procedure Laterality Date  . c-section  2004  . TONSILLECTOMY    . TUBAL LIGATION  2016    Current Outpatient Rx  . Order #: [id number] Class: Historical Med  . Order #: [id number] Class: Normal  . Order #: [id number] Class: Print  . Order #: [id number] Class: Normal    No current facility-administered medications for  this encounter.   Current Outpatient Prescriptions:  .  acetaminophen (TYLENOL) 325 MG tablet, Take 650 mg by mouth once., Disp: , Rfl:  .  benzonatate (TESSALON PERLES) 100 MG capsule, Take 1 capsule (100 mg total) by mouth 3 (three) times daily as needed for cough., Disp: 15 capsule, Rfl: 0 .  guaiFENesin-codeine 100-10 MG/5ML syrup, Take 5 mLs by mouth at bedtime as needed for cough. Do not drive or operate machinery while taking as can cause drowsiness., Disp: 75 mL, Rfl: 0 .  lisinopril-hydrochlorothiazide (PRINZIDE,ZESTORETIC) 20-12.5 MG tablet, Take 1 tablet by mouth daily., Disp: 30 tablet, Rfl: 3  Allergies Patient has no known allergies.  Family History  Problem Relation Age of Onset  . CAD Father   . CAD Mother   . Atrial fibrillation Mother   . Sudden Cardiac Death Brother   . Stroke Brother 7  . Sudden Cardiac Death Brother 20    Social History Social History  Substance Use Topics  . Smoking status: Current Every Day Smoker    Packs/day: 0.50    Types: Cigarettes  . Smokeless tobacco: Never Used  . Alcohol use No    Review of Systems Constitutional: As above. Eyes: No visual changes. ENT: As above. Cardiovascular: Denies chest pain. Respiratory: Denies shortness of breath. Gastrointestinal: No abdominal pain.  No nausea, no vomiting.  No diarrhea.  No constipation. Genitourinary: Negative for dysuria. Musculoskeletal: Negative for back pain. Skin: Negative for rash. Neurological: Negative for headaches, focal weakness or numbness.  10-point ROS otherwise negative.  ____________________________________________   PHYSICAL EXAM:  VITAL SIGNS: ED Triage Vitals  Enc Vitals Group     BP 03/02/16 1055 121/73  Pulse Rate 03/02/16 1055 87     Resp 03/02/16 1055 16     Temp 03/02/16 1055 98.5 F (36.9 C)     Temp Source 03/02/16 1055 Oral     SpO2 03/02/16 1055 100 %     Weight 03/02/16 1054 189 lb (85.7 kg)     Height 03/02/16 1054 5\' 1"  (1.549 m)       Head Circumference --      Peak Flow --      Pain Score 03/02/16 1055 8     Pain Loc --      Pain Edu? --      Excl. in [place]? --     Constitutional: Alert and oriented. Well appearing and in no acute distress. Eyes: Conjunctivae are normal. PERRL. EOMI. Head: Atraumatic. Mild bilateral frontal and tenderness to palpation. No swelling. No erythema.  Ears: no erythema, normal TMs bilaterally.   Nose:Nasal congestion with clear rhinorrhea  Mouth/Throat: Mucous membranes are moist. Mild pharyngeal erythema. No tonsillar swelling or exudate.  Neck: No stridor.  No cervical spine tenderness to palpation. Hematological/Lymphatic/Immunilogical: No cervical lymphadenopathy. Cardiovascular: Normal rate, regular rhythm. Grossly normal heart sounds.  Good peripheral circulation. Respiratory: Normal respiratory effort.  No retractions.No wheezes, rales or rhonchi. Good air movement.  Gastrointestinal: Soft and nontender.No CVA tenderness. Musculoskeletal: No lower extremity edema. No cervical, thoracic or lumbar tenderness to palpation. Neurologic:  Normal speech and language. No gait instability. Skin:  Skin is warm, dry and intact. No rash noted. Psychiatric: Mood and affect are normal. Speech and behavior are normal. ___________________________________________   LABS (all labs ordered are listed, but only abnormal results are displayed)  Labs Reviewed  RAPID INFLUENZA A&B ANTIGENS ([facility] ONLY)  RAPID STREP SCREEN (NOT AT [facility])  CULTURE, GROUP A STREP [facility])     PROCEDURES Procedures   INITIAL IMPRESSION / ASSESSMENT AND PLAN / ED COURSE  Pertinent labs & imaging results that were available during my care of the patient were reviewed by me and considered in my medical decision making (see chart for details).  Well-appearing patient. No acute distress. Suspect viral upper respiratory infection and viral pharyngitis. Influenza negative. Quick strep negative, will culture. Encouraged  supportive care. Will treat patient with Tessalon Perles and when necessary guaifenesin with codeine at night. Encourage rest, fluids, nasal saline rinses. Discussed indication, risks and benefits of medications with patient.   Discussed follow up with Primary care physician this week. Discussed follow up and return parameters including no resolution or any worsening concerns. Patient verbalized understanding and agreed to plan.   ____________________________________________   FINAL CLINICAL IMPRESSION(S) / ED DIAGNOSES  Final diagnoses:  Influenza-like illness     Discharge Medication List as of 03/02/2016 11:33 AM    START taking these medications   Details  benzonatate (TESSALON PERLES) 100 MG capsule Take 1 capsule (100 mg total) by mouth 3 (three) times daily as needed for cough., Starting Sat 03/02/2016, Normal    guaiFENesin-codeine 100-10 MG/5ML syrup Take 5 mLs by mouth at bedtime as needed for cough. Do not drive or operate machinery while taking as can cause drowsiness., Starting Sat 03/02/2016, Print        Note: This dictation was prepared with Dragon dictation along with smaller phrase technology. Any transcriptional errors that result from this process are unintentional.    Clinical Course       Marylene Land, NP 03/02/16 1144

## 2016-03-02 NOTE — ED Triage Notes (Signed)
Patient c/o cough, nasal congestion, sinus pressure, and bodyaches for the past 3-4 days. Patient reports low grade fever last night.

## 2016-03-02 NOTE — Discharge Instructions (Signed)
Take medication as prescribed. Rest. Drink plenty of fluids.  ° °Follow up with your primary care physician this week as needed. Return to Urgent care for new or worsening concerns.  ° °

## 2016-03-03 ENCOUNTER — Emergency Department
Admission: EM | Admit: 2016-03-03 | Discharge: 2016-03-03 | Disposition: A | Discharge status: Home / Self Care | Payer: 59 | Attending: Emergency Medicine | Admitting: Emergency Medicine

## 2016-03-03 ENCOUNTER — Encounter: Payer: Self-pay | Admitting: Emergency Medicine

## 2016-03-03 ENCOUNTER — Emergency Department: Payer: 59

## 2016-03-03 DIAGNOSIS — R0981 Nasal congestion: Secondary | ICD-10-CM

## 2016-03-03 DIAGNOSIS — Z79899 Other long term (current) drug therapy: Secondary | ICD-10-CM | POA: Insufficient documentation

## 2016-03-03 DIAGNOSIS — J069 Acute upper respiratory infection, unspecified: Secondary | ICD-10-CM | POA: Insufficient documentation

## 2016-03-03 DIAGNOSIS — R05 Cough: Secondary | ICD-10-CM | POA: Diagnosis present

## 2016-03-03 DIAGNOSIS — I10 Essential (primary) hypertension: Secondary | ICD-10-CM | POA: Insufficient documentation

## 2016-03-03 DIAGNOSIS — B9789 Other viral agents as the cause of diseases classified elsewhere: Secondary | ICD-10-CM

## 2016-03-03 DIAGNOSIS — F17210 Nicotine dependence, cigarettes, uncomplicated: Secondary | ICD-10-CM | POA: Diagnosis not present

## 2016-03-03 MED ORDER — PSEUDOEPHEDRINE HCL ER 120 MG PO TB12: 120.0000 mg | ORAL_TABLET | Freq: Two times a day (BID) | ORAL | 0 refills | Status: AC | PRN

## 2016-03-03 NOTE — Discharge Instructions (Signed)
Continue previous medications and start Sudafed as directed.

## 2016-03-03 NOTE — ED Notes (Signed)
See triage note  States she developed dry cough with some nasal and chest congestion last week was seen at [facility] yesterday  But feels like she can't breath   Also having some discomfort in chest with inspiration  Afebrile on arrival

## 2016-03-03 NOTE — ED Triage Notes (Signed)
Cough, sinus congestion x 2-3 days.  Seen through Urgent Care yesterday and was prescribed tussinex, coughing worsening.

## 2016-03-03 NOTE — ED Provider Notes (Signed)
[facility] Emergency Department Provider Note   ____________________________________________   First MD Initiated Contact with Patient 03/03/16 [id number]     (approximate)  I have reviewed the triage vital signs and the nursing notes.   HISTORY  Chief Complaint URI    HPI Theresa Bowen is a 36 y.o. female patient complaining of cough, sinus congestion and chest congestion food 3 days. Patient states she was seen at urgent care yesterday and was prescribed Tussionex patient state her cough denies dry.  . Patient denies any fever associated with this complaint. Patient denies nausea vomiting diarrhea.Patient has not taken flu shot this season.  Patient stated negative rapid flu tests at urgent care yesterday.    Past Medical History:  Diagnosis Date  . Hypertension     Patient Active Problem List   Diagnosis Date Noted  . Anxiety 07/22/2014  . Abnormal finding on thyroid function test 07/22/2014  . Essential (primary) hypertension 07/22/2014  . Anemia, iron deficiency 07/22/2014  . Local edema 07/22/2014  . LBP (low back pain) 07/22/2014    Past Surgical History:  Procedure Laterality Date  . c-section  2004  . TONSILLECTOMY    . TUBAL LIGATION  2016    Prior to Admission medications   Medication Sig Start Date End Date Taking? Authorizing Provider  acetaminophen (TYLENOL) 325 MG tablet Take 650 mg by mouth once.    Historical Provider, MD  benzonatate (TESSALON PERLES) 100 MG capsule Take 1 capsule (100 mg total) by mouth 3 (three) times daily as needed for cough. 03/02/16   Marylene Land, NP  guaiFENesin-codeine 100-10 MG/5ML syrup Take 5 mLs by mouth at bedtime as needed for cough. Do not drive or operate machinery while taking as can cause drowsiness. 03/02/16   Marylene Land, NP  lisinopril-hydrochlorothiazide (PRINZIDE,ZESTORETIC) 20-12.5 MG tablet Take 1 tablet by mouth daily. 11/17/15   Glean Hess, MD  pseudoephedrine (SUDAFED) 120  MG 12 hr tablet Take 1 tablet (120 mg total) by mouth 2 (two) times daily as needed for congestion. 03/03/16 03/03/17  Sable Feil, PA-C    Allergies Patient has no known allergies.  Family History  Problem Relation Age of Onset  . CAD Father   . CAD Mother   . Atrial fibrillation Mother   . Sudden Cardiac Death Brother   . Stroke Brother 49  . Sudden Cardiac Death Brother 71    Social History Social History  Substance Use Topics  . Smoking status: Current Every Day Smoker    Packs/day: 0.50    Types: Cigarettes  . Smokeless tobacco: Never Used  . Alcohol use No    Review of Systems Constitutional: No fever/chills Eyes: No visual changes. ENT: No sore throat. Cardiovascular: Denies chest pain. Respiratory: Denies shortness of breath. Gastrointestinal: No abdominal pain.  No nausea, no vomiting.  No diarrhea.  No constipation. Genitourinary: Negative for dysuria. Musculoskeletal: Negative for back pain. Skin: Negative for rash. Neurological: Negative for headaches, focal weakness or numbness. Psychiatric: anxiety  Endocrine: hypertension    ____________________________________________   PHYSICAL EXAM:  VITAL SIGNS: ED Triage Vitals  Enc Vitals Group     BP 03/03/16 0936 106/77     Pulse Rate 03/03/16 0936 (!) 116     Resp 03/03/16 0936 16     Temp 03/03/16 0936 98.2 F (36.8 C)     Temp Source 03/03/16 0936 Oral     SpO2 03/03/16 0936 100 %     Weight 03/03/16 0935  189 lb (85.7 kg)     Height 03/03/16 0935 5\' 1"  (1.549 m)     Head Circumference --      Peak Flow --      Pain Score 03/03/16 0935 0     Pain Loc --      Pain Edu? --      Excl. in [place]? --     Constitutional: Alert and oriented. Well appearing and in no acute distress. Eyes: Conjunctivae are normal. PERRL. EOMI. Head: Atraumatic. Nose: Bilateral maxillary guarding and edematous nasal turbinates. Rhinorrhea is clear. Mouth/Throat: Mucous membranes are moist.  Oropharynx  non-erythematous. Neck: No stridor.  No cervical spine tenderness to palpation. Hematological/Lymphatic/Immunilogical: No cervical lymphadenopathy. Cardiovascular: Normal rate, regular rhythm. Grossly normal heart sounds.  Good peripheral circulation. Respiratory: Normal respiratory effort.  No retractions. Lungs CTAB. Gastrointestinal: Soft and nontender. No distention. No abdominal bruits. No CVA tenderness. Musculoskeletal: No lower extremity tenderness nor edema.  No joint effusions. Neurologic:  Normal speech and language. No gross focal neurologic deficits are appreciated. No gait instability. Skin:  Skin is warm, dry and intact. No rash noted. Psychiatric: Mood and affect are normal. Speech and behavior are normal.  ____________________________________________   LABS (all labs ordered are listed, but only abnormal results are displayed)  Labs Reviewed - No data to display ____________________________________________  EKG   ____________________________________________  RADIOLOGY   __No acute findings on chest x-ray __________________________________________   PROCEDURES  Procedure(s) performed: None  Procedures  Critical Care performed: No  ____________________________________________   INITIAL IMPRESSION / ASSESSMENT AND PLAN / ED COURSE  Pertinent labs & imaging results that were available during my care of the patient were reviewed by me and considered in my medical decision making (see chart for details).  Nasal congestion secondary to URI. Patient advised to continue previous medications. Patient given discharge care instructions. Patient given a prescription for Sudafed*. Patient advised follow-up family doctor as needed.  Clinical Course      ____________________________________________   FINAL CLINICAL IMPRESSION(S) / ED DIAGNOSES  Final diagnoses:  Nasal sinus congestion  Viral URI with cough      NEW MEDICATIONS STARTED DURING THIS  VISIT:  New Prescriptions   PSEUDOEPHEDRINE (SUDAFED) 120 MG 12 HR TABLET    Take 1 tablet (120 mg total) by mouth 2 (two) times daily as needed for congestion.     Note:  This document was prepared using Dragon voice recognition software and may include unintentional dictation errors.    Sable Feil, PA-C 03/03/16 1025    Eula Listen, MD 03/03/16 1244

## 2016-03-05 LAB — CULTURE, GROUP A STREP (THRC)

## 2016-03-22 ENCOUNTER — Other Ambulatory Visit: Payer: Self-pay | Admitting: Internal Medicine

## 2016-03-22 DIAGNOSIS — I10 Essential (primary) hypertension: Secondary | ICD-10-CM

## 2016-04-25 ENCOUNTER — Ambulatory Visit (INDEPENDENT_AMBULATORY_CARE_PROVIDER_SITE_OTHER): Payer: 59 | Admitting: Internal Medicine

## 2016-04-25 ENCOUNTER — Encounter: Payer: Self-pay | Admitting: Internal Medicine

## 2016-04-25 VITALS — BP 138/80 | HR 114 | Ht 61.0 in | Wt 198.6 lb

## 2016-04-25 DIAGNOSIS — J3089 Other allergic rhinitis: Secondary | ICD-10-CM

## 2016-04-25 DIAGNOSIS — Z113 Encounter for screening for infections with a predominantly sexual mode of transmission: Secondary | ICD-10-CM | POA: Diagnosis not present

## 2016-04-25 DIAGNOSIS — Z0000 Encounter for general adult medical examination without abnormal findings: Secondary | ICD-10-CM | POA: Diagnosis not present

## 2016-04-25 DIAGNOSIS — N761 Subacute and chronic vaginitis: Secondary | ICD-10-CM | POA: Diagnosis not present

## 2016-04-25 DIAGNOSIS — I10 Essential (primary) hypertension: Secondary | ICD-10-CM

## 2016-04-25 DIAGNOSIS — D509 Iron deficiency anemia, unspecified: Secondary | ICD-10-CM | POA: Diagnosis not present

## 2016-04-25 DIAGNOSIS — R600 Localized edema: Secondary | ICD-10-CM

## 2016-04-25 DIAGNOSIS — Z124 Encounter for screening for malignant neoplasm of cervix: Secondary | ICD-10-CM | POA: Diagnosis not present

## 2016-04-25 LAB — POCT URINALYSIS DIPSTICK
Bilirubin, UA: NEGATIVE
Blood, UA: NEGATIVE
GLUCOSE UA: NEGATIVE
Ketones, UA: NEGATIVE
Leukocytes, UA: NEGATIVE
NITRITE UA: NEGATIVE
Protein, UA: NEGATIVE
Spec Grav, UA: 1.015
UROBILINOGEN UA: 0.2
pH, UA: 6

## 2016-04-25 LAB — POCT WET PREP WITH KOH
KOH Prep POC: NEGATIVE
RBC Wet Prep HPF POC: 0
TRICHOMONAS UA: NEGATIVE

## 2016-04-25 MED ORDER — METRONIDAZOLE 500 MG PO TABS: 500.0000 mg | ORAL_TABLET | Freq: Two times a day (BID) | ORAL | 0 refills | Status: DC

## 2016-04-25 NOTE — Patient Instructions (Addendum)
Get Claritin or Allegra to take for itching and allergy symptoms  Breast Self-Awareness Breast self-awareness means being familiar with how your breasts look and feel. It involves checking your breasts regularly and reporting any changes to your health care provider. Practicing breast self-awareness is important. A change in your breasts can be a sign of a serious medical problem. Being familiar with how your breasts look and feel allows you to find any problems early, when treatment is more likely to be successful. All women should practice breast self-awareness, including women who have had breast implants. How to do a breast self-exam One way to learn what is normal for your breasts and whether your breasts are changing is to do a breast self-exam. To do a breast self-exam: Look for Changes   1. Remove all the clothing above your waist. 2. Stand in front of a mirror in a room with good lighting. 3. Put your hands on your hips. 4. Push your hands firmly downward. 5. Compare your breasts in the mirror. Look for differences between them (asymmetry), such as:  Differences in shape.  Differences in size.  Puckers, dips, and bumps in one breast and not the other. 6. Look at each breast for changes in your skin, such as:  Redness.  Scaly areas. 7. Look for changes in your nipples, such as:  Discharge.  Bleeding.  Dimpling.  Redness.  A change in position. Feel for Changes   Carefully feel your breasts for lumps and changes. It is best to do this while lying on your back on the floor and again while sitting or standing in the shower or tub with soapy water on your skin. Feel each breast in the following way:  Place the arm on the side of the breast you are examining above your head.  Feel your breast with the other hand.  Start in the nipple area and make  inch (2 cm) overlapping circles to feel your breast. Use the pads of your three middle fingers to do this. Apply light  pressure, then medium pressure, then firm pressure. The light pressure will allow you to feel the tissue closest to the skin. The medium pressure will allow you to feel the tissue that is a little deeper. The firm pressure will allow you to feel the tissue close to the ribs.  Continue the overlapping circles, moving downward over the breast until you feel your ribs below your breast.  Move one finger-width toward the center of the body. Continue to use the  inch (2 cm) overlapping circles to feel your breast as you move slowly up toward your collarbone.  Continue the up and down exam using all three pressures until you reach your armpit. Write Down What You Find   Write down what is normal for each breast and any changes that you find. Keep a written record with breast changes or normal findings for each breast. By writing this information down, you do not need to depend only on memory for size, tenderness, or location. Write down where you are in your menstrual cycle, if you are still menstruating. If you are having trouble noticing differences in your breasts, do not get discouraged. With time you will become more familiar with the variations in your breasts and more comfortable with the exam. How often should I examine my breasts? Examine your breasts every month. If you are breastfeeding, the best time to examine your breasts is after a feeding or after using a breast pump. If  you menstruate, the best time to examine your breasts is 5-7 days after your period is over. During your period, your breasts are lumpier, and it may be more difficult to notice changes. When should I see my health care provider? See your health care provider if you notice:  A change in shape or size of your breasts or nipples.  A change in the skin of your breast or nipples, such as a reddened or scaly area.  Unusual discharge from your nipples.  A lump or thick area that was not there before.  Pain in your  breasts.  Anything that concerns you. This information is not intended to replace advice given to you by your health care provider. Make sure you discuss any questions you have with your health care provider. Document Released: 02/11/2005 Document Revised: 07/20/2015 Document Reviewed: 01/01/2015 Elsevier Interactive Patient Education  2017 [employer].

## 2016-04-25 NOTE — Progress Notes (Signed)
Date:  04/25/2016   Name:  Theresa Bowen   DOB:  05/04/1980   MRN:  [id number]   Chief Complaint: Annual Exam (Wants pap. Last one was normal 03/2014) Theresa Bowen is a 36 y.o. female who presents today for her Complete Annual Exam. She feels well. She reports exercising rarely. She reports she is sleeping fairly well. Menses are regular.  No breast complaints.  Hypertension  This is a chronic problem. The current episode started more than 1 year ago. The problem is unchanged. The problem is controlled. Pertinent negatives include no chest pain, headaches, palpitations or shortness of breath.  Rash  This is a new problem. The problem has been waxing and waning since onset. The affected locations include the right arm, left arm, chest and head. The rash is characterized by redness and itchiness. She was exposed to nothing. Pertinent negatives include no congestion, cough, diarrhea, fatigue, fever, shortness of breath or vomiting.    Review of Systems  Constitutional: Negative for chills, fatigue and fever.  HENT: Negative for congestion, hearing loss, tinnitus, trouble swallowing and voice change.   Eyes: Negative for visual disturbance.  Respiratory: Negative for cough, chest tightness, shortness of breath and wheezing.   Cardiovascular: Negative for chest pain, palpitations and leg swelling.  Gastrointestinal: Negative for abdominal pain, constipation, diarrhea and vomiting.  Endocrine: Negative for polydipsia and polyuria.  Genitourinary: Negative for dysuria, frequency, genital sores, vaginal bleeding and vaginal discharge.  Musculoskeletal: Negative for arthralgias, gait problem and joint swelling.  Skin: Negative for color change and rash.  Neurological: Negative for dizziness, tremors, light-headedness and headaches.  Hematological: Negative for adenopathy. Does not bruise/bleed easily.  Psychiatric/Behavioral: Negative for dysphoric mood and sleep disturbance. The patient  is not nervous/anxious.     Patient Active Problem List   Diagnosis Date Noted  . Anxiety 07/22/2014  . Abnormal finding on thyroid function test 07/22/2014  . Essential (primary) hypertension 07/22/2014  . Anemia, iron deficiency 07/22/2014  . Local edema 07/22/2014  . LBP (low back pain) 07/22/2014    Prior to Admission medications   Medication Sig Start Date End Date Taking? Authorizing Provider  acetaminophen (TYLENOL) 325 MG tablet Take 650 mg by mouth once.   Yes Historical Provider, MD  lisinopril-hydrochlorothiazide (PRINZIDE,ZESTORETIC) 20-12.5 MG tablet TAKE 1 TABLET BY MOUTH DAILY 03/22/16  Yes Glean Hess, MD  pseudoephedrine (SUDAFED) 120 MG 12 hr tablet Take 1 tablet (120 mg total) by mouth 2 (two) times daily as needed for congestion. 03/03/16 03/03/17 Yes Sable Feil, PA-C  benzonatate (TESSALON PERLES) 100 MG capsule Take 1 capsule (100 mg total) by mouth 3 (three) times daily as needed for cough. Patient not taking: Reported on 04/25/2016 03/02/16   Marylene Land, NP  guaiFENesin-codeine 100-10 MG/5ML syrup Take 5 mLs by mouth at bedtime as needed for cough. Do not drive or operate machinery while taking as can cause drowsiness. Patient not taking: Reported on 04/25/2016 03/02/16   Marylene Land, NP    No Known Allergies  Past Surgical History:  Procedure Laterality Date  . c-section  2004  . TONSILLECTOMY    . TUBAL LIGATION  2016    Social History  Substance Use Topics  . Smoking status: Current Every Day Smoker    Packs/day: 0.50    Types: Cigarettes  . Smokeless tobacco: Never Used  . Alcohol use No     Medication list has been reviewed and updated.   Physical Exam  Constitutional: She  is oriented to person, place, and time. She appears well-developed and well-nourished. No distress.  HENT:  Head: Normocephalic and atraumatic.  Right Ear: Tympanic membrane and ear canal normal.  Left Ear: Tympanic membrane and ear canal normal.  Nose: Right sinus  exhibits no maxillary sinus tenderness. Left sinus exhibits no maxillary sinus tenderness.  Mouth/Throat: Uvula is midline and oropharynx is clear and moist.  Eyes: Conjunctivae and EOM are normal. Right eye exhibits no discharge. Left eye exhibits no discharge. No scleral icterus.  Neck: Normal range of motion. Carotid bruit is not present. No erythema present. No thyromegaly present.  Cardiovascular: Normal rate, regular rhythm, normal heart sounds and normal pulses.   Pulmonary/Chest: Effort normal. No respiratory distress. She has no wheezes. Right breast exhibits no mass, no nipple discharge, no skin change and no tenderness. Left breast exhibits no mass, no nipple discharge, no skin change and no tenderness.  Abdominal: Soft. Bowel sounds are normal. There is no hepatosplenomegaly. There is no tenderness. There is no CVA tenderness.  Genitourinary: Uterus normal. There is no tenderness, lesion or injury on the right labia. There is no tenderness, lesion or injury on the left labia. Cervix exhibits no motion tenderness and no friability. Right adnexum displays no mass, no tenderness and no fullness. Left adnexum displays no mass, no tenderness and no fullness. No tenderness or bleeding in the vagina. Vaginal discharge found.  Musculoskeletal: Normal range of motion.  Lymphadenopathy:    She has no cervical adenopathy.    She has no axillary adenopathy.  Neurological: She is alert and oriented to person, place, and time. She has normal reflexes. No cranial nerve deficit or sensory deficit.  Skin: Skin is warm, dry and intact. No rash noted.  Tiny red macules over forehead, chest and wrists  Psychiatric: She has a normal mood and affect. Her speech is normal and behavior is normal. Thought content normal.  Nursing note and vitals reviewed.   BP 138/80   Pulse (!) 114   Ht 5\' 1"  (1.549 m)   Wt 198 lb 9.6 oz (90.1 kg)   LMP 03/28/2016 (Within Days)   SpO2 100%   BMI 37.53 kg/m    Assessment and Plan: 1. Annual physical exam Recommend regular exercise, healthy diet - Lipid panel  2. Cervical cancer screening - Pap, ThinPrep rflx HPV with CT/GC  3. Subacute vaginitis Take flagyl if sx occur - POCT Wet Prep with KOH  4. Essential (primary) hypertension controlled - Comprehensive metabolic panel - POCT urinalysis dipstick  5. Local edema - TSH  6. Iron deficiency anemia, unspecified iron deficiency anemia type - CBC with Differential/Platelet  7. Screening examination for STD (sexually transmitted disease) - Pap, ThinPrep rflx HPV with CT/GC  8. Environmental and seasonal allergies Recommend Claritin or Allegra May need Dermatology evaluation   Meds ordered this encounter  Medications  . metroNIDAZOLE (FLAGYL) 500 MG tablet    Sig: Take 1 tablet (500 mg total) by mouth 2 (two) times daily.    Dispense:  14 tablet    Refill:  0    Halina Maidens, MD [facility]  04/25/2016

## 2016-04-28 LAB — PAP, THINPREP RFLX HPV WITH CT/GC
CHLAMYDIA, NUC. ACID AMP: NEGATIVE
GONOCOCCUS, NUC. ACID AMP: NEGATIVE
PAP SMEAR COMMENT: 0

## 2016-05-02 ENCOUNTER — Telehealth: Payer: Self-pay

## 2016-05-02 NOTE — Telephone Encounter (Signed)
-----   Message from Glean Hess, MD sent at 05/02/2016 11:37 AM EST ----- She should have had labs on 3/1.  No results in chart.  If yes, call [facility].  If not, please document reason.

## 2016-05-02 NOTE — Telephone Encounter (Signed)
Spoke to pt regarding labs on 04/25/2016. Pt stated she had a bill with [facility] that she has to pay before she can get labs drawn.

## 2016-05-25 ENCOUNTER — Other Ambulatory Visit: Payer: Self-pay | Admitting: Internal Medicine

## 2016-05-27 ENCOUNTER — Other Ambulatory Visit: Payer: Self-pay | Admitting: Internal Medicine

## 2016-05-27 DIAGNOSIS — I10 Essential (primary) hypertension: Secondary | ICD-10-CM

## 2016-05-27 DIAGNOSIS — Z0000 Encounter for general adult medical examination without abnormal findings: Secondary | ICD-10-CM

## 2016-09-17 ENCOUNTER — Encounter: Payer: Self-pay | Admitting: Internal Medicine

## 2016-09-17 ENCOUNTER — Ambulatory Visit (INDEPENDENT_AMBULATORY_CARE_PROVIDER_SITE_OTHER): Payer: 59 | Admitting: Internal Medicine

## 2016-09-17 VITALS — BP 118/76 | HR 86 | Temp 98.2°F | Ht 61.0 in | Wt 194.0 lb

## 2016-09-17 DIAGNOSIS — H1033 Unspecified acute conjunctivitis, bilateral: Secondary | ICD-10-CM

## 2016-09-17 MED ORDER — NEOMYCIN-POLYMYXIN-DEXAMETH 3.5-10000-0.1 OP SUSP: 2.0000 [drp] | Freq: Four times a day (QID) | OPHTHALMIC | 0 refills | Status: DC

## 2016-09-17 NOTE — Patient Instructions (Signed)

## 2016-09-17 NOTE — Progress Notes (Signed)
Date:  09/17/2016   Name:  Theresa Bowen   DOB:  Oct 30, 1980   MRN:  [id number]   Chief Complaint: Sinusitis (Eye pain- sensitive to light. Little nasal congestion. No cough or fever. Slight ear discomfort. ) Eye Problem   Both eyes are affected.This is a new problem. The current episode started yesterday. The problem occurs constantly. The problem has been gradually worsening. There was no injury mechanism. The pain is moderate. There is no known exposure to pink eye. She wears contacts. Associated symptoms include an eye discharge, itching and photophobia. Pertinent negatives include no fever. She has tried nothing for the symptoms.      Review of Systems  Constitutional: Negative for chills, fatigue and fever.  HENT: Positive for postnasal drip and sneezing. Negative for ear pain, sinus pressure and sore throat.   Eyes: Positive for photophobia, discharge and itching.  Respiratory: Negative for chest tightness, shortness of breath and wheezing.   Cardiovascular: Negative for chest pain.    Patient Active Problem List   Diagnosis Date Noted  . Anxiety 07/22/2014  . Abnormal finding on thyroid function test 07/22/2014  . Essential (primary) hypertension 07/22/2014  . Anemia, iron deficiency 07/22/2014  . Local edema 07/22/2014  . LBP (low back pain) 07/22/2014    Prior to Admission medications   Medication Sig Start Date End Date Taking? Authorizing Provider  acetaminophen (TYLENOL) 325 MG tablet Take 650 mg by mouth once.   Yes [provider]  lisinopril-hydrochlorothiazide (PRINZIDE,ZESTORETIC) 20-12.5 MG tablet TAKE 1 TABLET BY MOUTH DAILY 03/22/16  Yes Glean Hess, MD  pseudoephedrine (SUDAFED) 120 MG 12 hr tablet Take 1 tablet (120 mg total) by mouth 2 (two) times daily as needed for congestion. 03/03/16 03/03/17 Yes Sable Feil, PA-C    No Known Allergies  Past Surgical History:  Procedure Laterality Date  . c-section  2004  . TONSILLECTOMY    .  TUBAL LIGATION  2016    Social History  Substance Use Topics  . Smoking status: Current Every Day Smoker    Packs/day: 0.50    Years: 15.00    Types: Cigarettes  . Smokeless tobacco: Never Used  . Alcohol use No     Medication list has been reviewed and updated.   Physical Exam  Constitutional: She is oriented to person, place, and time. She appears well-developed. No distress.  HENT:  Head: Normocephalic and atraumatic.  Right Ear: Tympanic membrane and ear canal normal.  Left Ear: Tympanic membrane and ear canal normal.  Nose: Right sinus exhibits no maxillary sinus tenderness and no frontal sinus tenderness. Left sinus exhibits no maxillary sinus tenderness and no frontal sinus tenderness.  Mouth/Throat: No posterior oropharyngeal erythema.  Eyes: Pupils are equal, round, and reactive to light. EOM are normal. Right eye exhibits chemosis. Left eye exhibits chemosis. Right conjunctiva is injected. Left conjunctiva is injected.  Cardiovascular: Normal rate, regular rhythm and normal heart sounds.   Pulmonary/Chest: Effort normal and breath sounds normal. No respiratory distress. She has no decreased breath sounds.  Musculoskeletal: Normal range of motion.  Lymphadenopathy:    She has no cervical adenopathy.  Neurological: She is alert and oriented to person, place, and time.  Skin: Skin is warm and dry. No rash noted.  Psychiatric: She has a normal mood and affect. Her behavior is normal. Thought content normal.  Nursing note and vitals reviewed.   BP 118/76   Pulse 86   Temp 98.2 F (36.8 C)  Ht 5\' 1"  (1.549 m)   Wt 194 lb (88 kg)   LMP 08/25/2016 (Exact Date)   SpO2 100%   BMI 36.66 kg/m   Assessment and Plan: 1. Acute conjunctivitis of both eyes, unspecified acute conjunctivitis type Discard current contacts Wear glasses only for next 5 days until sx resolve - neomycin-polymyxin b-dexamethasone (MAXITROL) 3.5-10000-0.1 SUSP; Place 2 drops into both eyes every  6 (six) hours.  Dispense: 5 mL; Refill: 0   Meds ordered this encounter  Medications  . neomycin-polymyxin b-dexamethasone (MAXITROL) 3.5-10000-0.1 SUSP    Sig: Place 2 drops into both eyes every 6 (six) hours.    Dispense:  5 mL    Refill:  0    Halina Maidens, MD [facility]  09/17/2016

## 2016-09-18 ENCOUNTER — Other Ambulatory Visit: Payer: Self-pay | Admitting: Internal Medicine

## 2016-09-18 ENCOUNTER — Ambulatory Visit: Payer: 59 | Admitting: Internal Medicine

## 2016-09-18 DIAGNOSIS — I10 Essential (primary) hypertension: Secondary | ICD-10-CM

## 2016-10-19 ENCOUNTER — Other Ambulatory Visit: Payer: Self-pay | Admitting: Internal Medicine

## 2016-10-19 DIAGNOSIS — I10 Essential (primary) hypertension: Secondary | ICD-10-CM

## 2016-10-30 ENCOUNTER — Encounter: Payer: Self-pay | Admitting: Internal Medicine

## 2016-10-30 ENCOUNTER — Ambulatory Visit (INDEPENDENT_AMBULATORY_CARE_PROVIDER_SITE_OTHER): Payer: 59 | Admitting: Internal Medicine

## 2016-10-30 VITALS — BP 112/74 | HR 90 | Temp 98.4°F | Ht 61.0 in | Wt 194.0 lb

## 2016-10-30 DIAGNOSIS — J0100 Acute maxillary sinusitis, unspecified: Secondary | ICD-10-CM | POA: Diagnosis not present

## 2016-10-30 MED ORDER — AMOXICILLIN-POT CLAVULANATE 875-125 MG PO TABS: 1.0000 | ORAL_TABLET | Freq: Two times a day (BID) | ORAL | 0 refills | Status: DC

## 2016-10-30 MED ORDER — LORATADINE 10 MG PO TABS: 10.0000 mg | ORAL_TABLET | Freq: Every day | ORAL | 5 refills | Status: DC

## 2016-10-30 MED ORDER — FLUTICASONE PROPIONATE 50 MCG/ACT NA SUSP: 2.0000 | Freq: Every day | NASAL | 6 refills | Status: DC

## 2016-10-30 NOTE — Patient Instructions (Signed)
For infection - take all 5 things - augmentin, claritin, flonase, sudafed and tylenol  For onset of congestion - begin claritin, flonase and sudafed (as needed)

## 2016-10-30 NOTE — Progress Notes (Signed)
Date:  10/30/2016   Name:  Theresa Bowen   DOB:  1981-01-18   MRN:  [id number]   Chief Complaint: Sinusitis (2 weeks. Congestion- having trouble breathing. Sinus' feel full. No cough just sneezing. No production coming up. ) and Ear Pain (Both ears are painful- 2 weeks. Tried sudafed and tylenol and nothing helping. )  Sinusitis  This is a new problem. The current episode started 1 to 4 weeks ago. The problem has been gradually worsening since onset. There has been no fever. Associated symptoms include congestion, sinus pressure, sneezing and a sore throat. Pertinent negatives include no coughing or shortness of breath.     Review of Systems  HENT: Positive for congestion, sinus pressure, sneezing and sore throat.   Respiratory: Negative for cough, chest tightness, shortness of breath and wheezing.   Cardiovascular: Negative for chest pain, palpitations and leg swelling.    Patient Active Problem List   Diagnosis Date Noted  . Anxiety 07/22/2014  . Abnormal finding on thyroid function test 07/22/2014  . Essential (primary) hypertension 07/22/2014  . Anemia, iron deficiency 07/22/2014  . Local edema 07/22/2014  . LBP (low back pain) 07/22/2014    Prior to Admission medications   Medication Sig Start Date End Date Taking? Authorizing Provider  acetaminophen (TYLENOL) 325 MG tablet Take 650 mg by mouth once.   Yes [provider]  lisinopril-hydrochlorothiazide (PRINZIDE,ZESTORETIC) 20-12.5 MG tablet TAKE 1 TABLET BY MOUTH DAILY 10/20/16  Yes Glean Hess, MD  neomycin-polymyxin b-dexamethasone (MAXITROL) 3.5-10000-0.1 SUSP Place 2 drops into both eyes every 6 (six) hours. 09/17/16  Yes Glean Hess, MD  pseudoephedrine (SUDAFED) 120 MG 12 hr tablet Take 1 tablet (120 mg total) by mouth 2 (two) times daily as needed for congestion. 03/03/16 03/03/17 Yes Sable Feil, PA-C    No Known Allergies  Past Surgical History:  Procedure Laterality Date  . c-section   2004  . TONSILLECTOMY    . TUBAL LIGATION  2016    Social History  Substance Use Topics  . Smoking status: Current Every Day Smoker    Packs/day: 0.50    Years: 15.00    Types: Cigarettes  . Smokeless tobacco: Never Used  . Alcohol use No     Medication list has been reviewed and updated.  PHQ 2/9 Scores 10/30/2016  PHQ - 2 Score 0    Physical Exam  Constitutional: She is oriented to person, place, and time. She appears well-developed and well-nourished.  HENT:  Right Ear: External ear and ear canal normal. Tympanic membrane is retracted. Tympanic membrane is not erythematous.  Nose: Right sinus exhibits maxillary sinus tenderness and frontal sinus tenderness. Left sinus exhibits maxillary sinus tenderness and frontal sinus tenderness.  Mouth/Throat: Uvula is midline and mucous membranes are normal. No oral lesions. Posterior oropharyngeal erythema present. No oropharyngeal exudate.  Left canal obscured by cerumen  Cardiovascular: Normal rate, regular rhythm and normal heart sounds.   Pulmonary/Chest: Breath sounds normal. She has no wheezes. She has no rales.  Lymphadenopathy:    She has no cervical adenopathy.  Neurological: She is alert and oriented to person, place, and time.    BP 112/74   Pulse 90   Temp 98.4 F (36.9 C)   Ht 5\' 1"  (1.549 m)   Wt 194 lb (88 kg)   LMP 10/22/2016 (Exact Date)   SpO2 100%   BMI 36.66 kg/m   Assessment and Plan: 1. Acute non-recurrent maxillary sinusitis Continue sudafed and  tylenol as needed - amoxicillin-clavulanate (AUGMENTIN) 875-125 MG tablet; Take 1 tablet by mouth 2 (two) times daily.  Dispense: 20 tablet; Refill: 0 - fluticasone (FLONASE) 50 MCG/ACT nasal spray; Place 2 sprays into both nostrils daily.  Dispense: 16 g; Refill: 6 - loratadine (CLARITIN) 10 MG tablet; Take 1 tablet (10 mg total) by mouth daily.  Dispense: 30 tablet; Refill: 5   Meds ordered this encounter  Medications  . amoxicillin-clavulanate  (AUGMENTIN) 875-125 MG tablet    Sig: Take 1 tablet by mouth 2 (two) times daily.    Dispense:  20 tablet    Refill:  0  . fluticasone (FLONASE) 50 MCG/ACT nasal spray    Sig: Place 2 sprays into both nostrils daily.    Dispense:  16 g    Refill:  6  . loratadine (CLARITIN) 10 MG tablet    Sig: Take 1 tablet (10 mg total) by mouth daily.    Dispense:  30 tablet    Refill:  5    Partially dictated using Editor, commissioning. Any errors are unintentional.  Halina Maidens, MD [facility]  10/30/2016

## 2017-02-20 ENCOUNTER — Ambulatory Visit: Payer: 59

## 2017-03-21 ENCOUNTER — Other Ambulatory Visit: Payer: Self-pay | Admitting: Otolaryngology

## 2017-03-21 DIAGNOSIS — R5381 Other malaise: Secondary | ICD-10-CM

## 2017-03-25 ENCOUNTER — Other Ambulatory Visit: Payer: Self-pay | Admitting: Otolaryngology

## 2017-03-25 DIAGNOSIS — Z0189 Encounter for other specified special examinations: Secondary | ICD-10-CM

## 2017-04-28 ENCOUNTER — Ambulatory Visit (INDEPENDENT_AMBULATORY_CARE_PROVIDER_SITE_OTHER): Payer: 59 | Admitting: Internal Medicine

## 2017-04-28 ENCOUNTER — Other Ambulatory Visit
Admission: RE | Admit: 2017-04-28 | Discharge: 2017-04-28 | Disposition: A | Discharge status: Home / Self Care | Payer: 59 | Source: Ambulatory Visit | Attending: Internal Medicine | Admitting: Internal Medicine

## 2017-04-28 ENCOUNTER — Encounter: Payer: Self-pay | Admitting: Internal Medicine

## 2017-04-28 VITALS — BP 122/68 | HR 92 | Ht 61.0 in | Wt 199.0 lb

## 2017-04-28 DIAGNOSIS — Z23 Encounter for immunization: Secondary | ICD-10-CM

## 2017-04-28 DIAGNOSIS — R946 Abnormal results of thyroid function studies: Secondary | ICD-10-CM | POA: Diagnosis not present

## 2017-04-28 DIAGNOSIS — D509 Iron deficiency anemia, unspecified: Secondary | ICD-10-CM

## 2017-04-28 DIAGNOSIS — I10 Essential (primary) hypertension: Secondary | ICD-10-CM

## 2017-04-28 DIAGNOSIS — Z0000 Encounter for general adult medical examination without abnormal findings: Secondary | ICD-10-CM | POA: Insufficient documentation

## 2017-04-28 DIAGNOSIS — Z0001 Encounter for general adult medical examination with abnormal findings: Secondary | ICD-10-CM

## 2017-04-28 LAB — COMPREHENSIVE METABOLIC PANEL
ALK PHOS: 73 U/L (ref 38–126)
ALT: 19 U/L (ref 14–54)
ANION GAP: 10 (ref 5–15)
AST: 24 U/L (ref 15–41)
Albumin: 4.4 g/dL (ref 3.5–5.0)
BILIRUBIN TOTAL: 0.4 mg/dL (ref 0.3–1.2)
BUN: 8 mg/dL (ref 6–20)
CALCIUM: 8.9 mg/dL (ref 8.9–10.3)
CO2: 26 mmol/L (ref 22–32)
Chloride: 99 mmol/L — ABNORMAL LOW (ref 101–111)
Creatinine, Ser: 0.53 mg/dL (ref 0.44–1.00)
GFR calc non Af Amer: >60 mL/min (ref >60)
Glucose, Bld: 99 mg/dL (ref 65–99)
Potassium: 3 mmol/L — ABNORMAL LOW (ref 3.5–5.1)
SODIUM: 135 mmol/L (ref 135–145)
TOTAL PROTEIN: 8.6 g/dL — AB (ref 6.5–8.1)

## 2017-04-28 LAB — CBC WITH DIFFERENTIAL/PLATELET
BASOS ABS: 0 10*3/uL (ref 0–0.1)
BASOS PCT: 0 %
Eosinophils Absolute: 0 10*3/uL (ref 0–0.7)
Eosinophils Relative: 0 %
HEMATOCRIT: 28 % — AB (ref 35.0–47.0)
HEMOGLOBIN: 8.7 g/dL — AB (ref 12.0–16.0)
Lymphocytes Relative: 36 %
Lymphs Abs: 1.8 10*3/uL (ref 1.0–3.6)
MCH: 18.7 pg — ABNORMAL LOW (ref 26.0–34.0)
MCHC: 31 g/dL — ABNORMAL LOW (ref 32.0–36.0)
MCV: 60.4 fL — ABNORMAL LOW (ref 80.0–100.0)
Monocytes Absolute: 0.3 10*3/uL (ref 0.2–0.9)
Monocytes Relative: 6 %
NEUTROS ABS: 3 10*3/uL (ref 1.4–6.5)
NEUTROS PCT: 58 %
Platelets: 388 10*3/uL (ref 150–440)
RBC: 4.63 MIL/uL (ref 3.80–5.20)
RDW: 17.2 % — ABNORMAL HIGH (ref 11.5–14.5)
WBC: 5.1 10*3/uL (ref 3.6–11.0)

## 2017-04-28 LAB — POCT URINALYSIS DIPSTICK
BILIRUBIN UA: NEGATIVE
GLUCOSE UA: NEGATIVE
Ketones, UA: NEGATIVE
LEUKOCYTES UA: NEGATIVE
Nitrite, UA: NEGATIVE
Protein, UA: NEGATIVE
RBC UA: NEGATIVE
SPEC GRAV UA: 1.01 (ref 1.010–1.025)
Urobilinogen, UA: 0.2 E.U./dL
pH, UA: 6 (ref 5.0–8.0)

## 2017-04-28 LAB — LIPID PANEL
Cholesterol: 224 mg/dL — ABNORMAL HIGH (ref 0–200)
HDL: 53 mg/dL (ref >40)
LDL CALC: 158 mg/dL — AB (ref 0–99)
TRIGLYCERIDES: 67 mg/dL (ref <150)
Total CHOL/HDL Ratio: 4.2 RATIO
VLDL: 13 mg/dL (ref 0–40)

## 2017-04-28 NOTE — Progress Notes (Signed)
Date:  04/28/2017   Name:  Theresa Bowen   DOB:  Dec 08, 1980   MRN:  [id number]   Chief Complaint: Annual Exam (Flu Shot. Breast Exam. ) Theresa Bowen is a 37 y.o. female who presents today for her Complete Annual Exam. She feels well. She reports exercising none. She reports she is sleeping well.   Hypertension  This is a chronic problem. The problem is controlled. Pertinent negatives include no chest pain, headaches, palpitations or shortness of breath. Past treatments include ACE inhibitors and diuretics.    Review of Systems  Constitutional: Negative for chills, fatigue and fever.  HENT: Negative for congestion, hearing loss, tinnitus, trouble swallowing and voice change.   Eyes: Negative for visual disturbance.  Respiratory: Negative for cough, chest tightness, shortness of breath and wheezing.   Cardiovascular: Negative for chest pain, palpitations and leg swelling.  Gastrointestinal: Negative for abdominal pain, constipation, diarrhea and vomiting.  Endocrine: Negative for polydipsia and polyuria.  Genitourinary: Negative for dysuria, frequency, genital sores, vaginal bleeding and vaginal discharge.  Musculoskeletal: Negative for arthralgias, gait problem and joint swelling.  Skin: Negative for color change and rash.  Neurological: Negative for dizziness, tremors, light-headedness and headaches.  Hematological: Negative for adenopathy. Does not bruise/bleed easily.  Psychiatric/Behavioral: Negative for dysphoric mood and sleep disturbance. The patient is not nervous/anxious.     Patient Active Problem List   Diagnosis Date Noted  . Anxiety 07/22/2014  . Abnormal finding on thyroid function test 07/22/2014  . Essential (primary) hypertension 07/22/2014  . Anemia, iron deficiency 07/22/2014  . Local edema 07/22/2014  . LBP (low back pain) 07/22/2014    Prior to Admission medications   Medication Sig Start Date End Date Taking? Authorizing Provider  acetaminophen  (TYLENOL) 325 MG tablet Take 650 mg by mouth once.    [provider]  fluticasone (FLONASE) 50 MCG/ACT nasal spray Place 2 sprays into both nostrils daily. 10/30/16   Glean Hess, MD  lisinopril-hydrochlorothiazide (PRINZIDE,ZESTORETIC) 20-12.5 MG tablet TAKE 1 TABLET BY MOUTH DAILY 10/20/16   Glean Hess, MD  loratadine (CLARITIN) 10 MG tablet Take 1 tablet (10 mg total) by mouth daily. 10/30/16   Glean Hess, MD    No Known Allergies  Past Surgical History:  Procedure Laterality Date  . c-section  2004  . TONSILLECTOMY    . TUBAL LIGATION  2016    Social History   Tobacco Use  . Smoking status: Current Every Day Smoker    Packs/day: 0.50    Years: 15.00    Pack years: 7.50    Types: Cigarettes  . Smokeless tobacco: Never Used  Substance Use Topics  . Alcohol use: No    Alcohol/week: 0.0 oz  . Drug use: No     Medication list has been reviewed and updated.  PHQ 2/9 Scores 10/30/2016  PHQ - 2 Score 0    Physical Exam  Constitutional: She is oriented to person, place, and time. She appears well-developed and well-nourished. No distress.  HENT:  Head: Normocephalic and atraumatic.  Right Ear: Tympanic membrane and ear canal normal.  Left Ear: Tympanic membrane and ear canal normal.  Nose: Right sinus exhibits no maxillary sinus tenderness. Left sinus exhibits no maxillary sinus tenderness.  Mouth/Throat: Uvula is midline and oropharynx is clear and moist.  Eyes: Conjunctivae and EOM are normal. Right eye exhibits no discharge. Left eye exhibits no discharge. No scleral icterus.  Neck: Normal range of motion. Carotid bruit is not  present. No erythema present. No thyromegaly present.  Cardiovascular: Normal rate, regular rhythm, normal heart sounds and normal pulses.  Pulmonary/Chest: Effort normal. No respiratory distress. She has no wheezes. Right breast exhibits no mass, no nipple discharge, no skin change and no tenderness. Left breast exhibits no  mass, no nipple discharge, no skin change and no tenderness.  Abdominal: Soft. Bowel sounds are normal. There is no hepatosplenomegaly. There is no tenderness. There is no CVA tenderness.  Musculoskeletal: Normal range of motion.  Lymphadenopathy:    She has no cervical adenopathy.    She has no axillary adenopathy.  Neurological: She is alert and oriented to person, place, and time. She has normal reflexes. No cranial nerve deficit or sensory deficit.  Skin: Skin is warm, dry and intact. No rash noted.  Psychiatric: She has a normal mood and affect. Her speech is normal and behavior is normal. Thought content normal.  Nursing note and vitals reviewed.   BP 122/68   Pulse 92   Ht 5\' 1"  (1.549 m)   Wt 199 lb (90.3 kg)   LMP  (Exact Date)   SpO2 100%   BMI 37.60 kg/m   Assessment and Plan: 1. Annual physical exam Encourage regular exercise and dietary changes for weight loss - Lipid panel - POCT urinalysis dipstick  2. Essential (primary) hypertension controlled - Comprehensive metabolic panel  3. Abnormal finding on thyroid function test - Thyroid Panel With TSH  4. Iron deficiency anemia, unspecified iron deficiency anemia type - CBC with Differential/Platelet  5. Need for influenza vaccination - Flu Vaccine QUAD 36+ mos IM   No orders of the defined types were placed in this encounter.   Partially dictated using Editor, commissioning. Any errors are unintentional.  Halina Maidens, MD [facility]  04/28/2017

## 2017-04-28 NOTE — Patient Instructions (Signed)
DASH Eating Plan DASH stands for "Dietary Approaches to Stop Hypertension." The DASH eating plan is a healthy eating plan that has been shown to reduce high blood pressure (hypertension). It may also reduce your risk for type 2 diabetes, heart disease, and stroke. The DASH eating plan may also help with weight loss. What are tips for following this plan? General guidelines  Avoid eating more than 2,300 mg (milligrams) of salt (sodium) a day. If you have hypertension, you may need to reduce your sodium intake to 1,500 mg a day.  Limit alcohol intake to no more than 1 drink a day for nonpregnant women and 2 drinks a day for men. One drink equals 12 oz of beer, 5 oz of wine, or 1 oz of hard liquor.  Work with your health care provider to maintain a healthy body weight or to lose weight. Ask what an ideal weight is for you.  Get at least 30 minutes of exercise that causes your heart to beat faster (aerobic exercise) most days of the week. Activities may include walking, swimming, or biking.  Work with your health care provider or diet and nutrition specialist (dietitian) to adjust your eating plan to your individual calorie needs. Reading food labels  Check food labels for the amount of sodium per serving. Choose foods with less than 5 percent of the Daily Value of sodium. Generally, foods with less than 300 mg of sodium per serving fit into this eating plan.  To find whole grains, look for the word "whole" as the first word in the ingredient list. Shopping  Buy products labeled as "low-sodium" or "no salt added."  Buy fresh foods. Avoid canned foods and premade or frozen meals. Cooking  Avoid adding salt when cooking. Use salt-free seasonings or herbs instead of table salt or sea salt. Check with your health care provider or pharmacist before using salt substitutes.  Do not fry foods. Cook foods using healthy methods such as baking, boiling, grilling, and broiling instead.  Cook with  heart-healthy oils, such as olive, canola, soybean, or sunflower oil. Meal planning   Eat a balanced diet that includes: ? 5 or more servings of fruits and vegetables each day. At each meal, try to fill half of your plate with fruits and vegetables. ? Up to 6-8 servings of whole grains each day. ? Less than 6 oz of lean meat, poultry, or fish each day. A 3-oz serving of meat is about the same size as a deck of cards. One egg equals 1 oz. ? 2 servings of low-fat dairy each day. ? A serving of nuts, seeds, or beans 5 times each week. ? Heart-healthy fats. Healthy fats called Omega-3 fatty acids are found in foods such as flaxseeds and coldwater fish, like sardines, salmon, and mackerel.  Limit how much you eat of the following: ? Canned or prepackaged foods. ? Food that is high in trans fat, such as fried foods. ? Food that is high in saturated fat, such as fatty meat. ? Sweets, desserts, sugary drinks, and other foods with added sugar. ? Full-fat dairy products.  Do not salt foods before eating.  Try to eat at least 2 vegetarian meals each week.  Eat more home-cooked food and less restaurant, buffet, and fast food.  When eating at a restaurant, ask that your food be prepared with less salt or no salt, if possible. What foods are recommended? The items listed may not be a complete list. Talk with your dietitian about what   dietary choices are best for you. Grains Whole-grain or whole-wheat bread. Whole-grain or whole-wheat pasta. Brown rice. Oatmeal. Quinoa. Bulgur. Whole-grain and low-sodium cereals. Pita bread. Low-fat, low-sodium crackers. Whole-wheat flour tortillas. Vegetables Fresh or frozen vegetables (raw, steamed, roasted, or grilled). Low-sodium or reduced-sodium tomato and vegetable juice. Low-sodium or reduced-sodium tomato sauce and tomato paste. Low-sodium or reduced-sodium canned vegetables. Fruits All fresh, dried, or frozen fruit. Canned fruit in natural juice (without  added sugar). Meat and other protein foods Skinless chicken or turkey. Ground chicken or turkey. Pork with fat trimmed off. Fish and seafood. Egg whites. Dried beans, peas, or lentils. Unsalted nuts, nut butters, and seeds. Unsalted canned beans. Lean cuts of beef with fat trimmed off. Low-sodium, lean deli meat. Dairy Low-fat (1%) or fat-free (skim) milk. Fat-free, low-fat, or reduced-fat cheeses. Nonfat, low-sodium ricotta or cottage cheese. Low-fat or nonfat yogurt. Low-fat, low-sodium cheese. Fats and oils Soft margarine without trans fats. Vegetable oil. Low-fat, reduced-fat, or light mayonnaise and salad dressings (reduced-sodium). Canola, safflower, olive, soybean, and sunflower oils. Avocado. Seasoning and other foods Herbs. Spices. Seasoning mixes without salt. Unsalted popcorn and pretzels. Fat-free sweets. What foods are not recommended? The items listed may not be a complete list. Talk with your dietitian about what dietary choices are best for you. Grains Baked goods made with fat, such as croissants, muffins, or some breads. Dry pasta or rice meal packs. Vegetables Creamed or fried vegetables. Vegetables in a cheese sauce. Regular canned vegetables (not low-sodium or reduced-sodium). Regular canned tomato sauce and paste (not low-sodium or reduced-sodium). Regular tomato and vegetable juice (not low-sodium or reduced-sodium). Pickles. Olives. Fruits Canned fruit in a light or heavy syrup. Fried fruit. Fruit in cream or butter sauce. Meat and other protein foods Fatty cuts of meat. Ribs. Fried meat. Bacon. Sausage. Bologna and other processed lunch meats. Salami. Fatback. Hotdogs. Bratwurst. Salted nuts and seeds. Canned beans with added salt. Canned or smoked fish. Whole eggs or egg yolks. Chicken or turkey with skin. Dairy Whole or 2% milk, cream, and half-and-half. Whole or full-fat cream cheese. Whole-fat or sweetened yogurt. Full-fat cheese. Nondairy creamers. Whipped toppings.  Processed cheese and cheese spreads. Fats and oils Butter. Stick margarine. Lard. Shortening. Ghee. Bacon fat. Tropical oils, such as coconut, palm kernel, or palm oil. Seasoning and other foods Salted popcorn and pretzels. Onion salt, garlic salt, seasoned salt, table salt, and sea salt. Worcestershire sauce. Tartar sauce. Barbecue sauce. Teriyaki sauce. Soy sauce, including reduced-sodium. Steak sauce. Canned and packaged gravies. Fish sauce. Oyster sauce. Cocktail sauce. Horseradish that you find on the shelf. Ketchup. Mustard. Meat flavorings and tenderizers. Bouillon cubes. Hot sauce and Tabasco sauce. Premade or packaged marinades. Premade or packaged taco seasonings. Relishes. Regular salad dressings. Where to find more information:  National Heart, Lung, and Blood Institute: www.nhlbi.nih.gov  American Heart Association: www.heart.org Summary  The DASH eating plan is a healthy eating plan that has been shown to reduce high blood pressure (hypertension). It may also reduce your risk for type 2 diabetes, heart disease, and stroke.  With the DASH eating plan, you should limit salt (sodium) intake to 2,300 mg a day. If you have hypertension, you may need to reduce your sodium intake to 1,500 mg a day.  When on the DASH eating plan, aim to eat more fresh fruits and vegetables, whole grains, lean proteins, low-fat dairy, and heart-healthy fats.  Work with your health care provider or diet and nutrition specialist (dietitian) to adjust your eating plan to your individual   calorie needs. This information is not intended to replace advice given to you by your health care provider. Make sure you discuss any questions you have with your health care provider. Document Released: 01/31/2011 Document Revised: 02/05/2016 Document Reviewed: 02/05/2016 Elsevier Interactive Patient Education  2018 Elsevier Inc.  

## 2017-04-29 ENCOUNTER — Encounter: Payer: Self-pay | Admitting: Internal Medicine

## 2017-04-29 DIAGNOSIS — E785 Hyperlipidemia, unspecified: Secondary | ICD-10-CM

## 2017-04-29 HISTORY — DX: Hyperlipidemia, unspecified: E78.5

## 2017-04-29 LAB — THYROID PANEL WITH TSH
Free Thyroxine Index: 1.7 (ref 1.2–4.9)
T3 Uptake Ratio: 21 % — ABNORMAL LOW (ref 24–39)
T4, Total: 7.9 ug/dL (ref 4.5–12.0)
TSH: 0.879 u[IU]/mL (ref 0.450–4.500)

## 2017-05-16 ENCOUNTER — Other Ambulatory Visit: Payer: Self-pay

## 2017-05-16 DIAGNOSIS — Z0000 Encounter for general adult medical examination without abnormal findings: Secondary | ICD-10-CM

## 2017-05-19 ENCOUNTER — Other Ambulatory Visit
Admission: RE | Admit: 2017-05-19 | Discharge: 2017-05-19 | Disposition: A | Discharge status: Home / Self Care | Payer: 59 | Source: Ambulatory Visit | Attending: Internal Medicine | Admitting: Internal Medicine

## 2017-05-19 DIAGNOSIS — Z0000 Encounter for general adult medical examination without abnormal findings: Secondary | ICD-10-CM | POA: Diagnosis not present

## 2017-05-19 LAB — HEMOGLOBIN A1C
HEMOGLOBIN A1C: 5.5 % (ref 4.8–5.6)
Mean Plasma Glucose: 111.15 mg/dL

## 2017-06-18 ENCOUNTER — Encounter: Payer: Self-pay | Admitting: Internal Medicine

## 2017-06-18 ENCOUNTER — Ambulatory Visit (INDEPENDENT_AMBULATORY_CARE_PROVIDER_SITE_OTHER): Payer: 59 | Admitting: Internal Medicine

## 2017-06-18 VITALS — BP 124/80 | HR 103 | Temp 98.8°F | Ht 61.0 in | Wt 199.0 lb

## 2017-06-18 DIAGNOSIS — H10023 Other mucopurulent conjunctivitis, bilateral: Secondary | ICD-10-CM | POA: Diagnosis not present

## 2017-06-18 DIAGNOSIS — J069 Acute upper respiratory infection, unspecified: Secondary | ICD-10-CM

## 2017-06-18 MED ORDER — AZITHROMYCIN 250 MG PO TABS: ORAL_TABLET | ORAL | 0 refills | Status: DC

## 2017-06-18 MED ORDER — NEOMYCIN-POLYMYXIN-DEXAMETH 3.5-10000-0.1 OP SUSP: 2.0000 [drp] | Freq: Four times a day (QID) | OPHTHALMIC | 0 refills | Status: DC

## 2017-06-18 NOTE — Progress Notes (Signed)
Date:  06/18/2017   Name:  Theresa Bowen   DOB:  May 01, 1980   MRN:  [id number]   Chief Complaint: Cough (Cough- chest congestion with discomfort. Sinus pain and ear pain in both ears. X 1 week. Low grade fever. Clear production with cough.  )  URI   This is a new problem. The current episode started in the past 7 days. The problem has been unchanged. There has been no fever. Associated symptoms include congestion, coughing, a plugged ear sensation, rhinorrhea and a sore throat. Pertinent negatives include no abdominal pain, chest pain, diarrhea, nausea, vomiting or wheezing. She has tried decongestant for the symptoms. The treatment provided mild relief.  Conjunctivitis   The current episode started 2 days ago. The onset was gradual. The problem is mild. Associated symptoms include congestion, rhinorrhea, sore throat, cough and URI. Pertinent negatives include no fever, no abdominal pain, no diarrhea, no nausea, no vomiting and no wheezing. The eye pain is mild. Both eyes are affected.The eye pain is associated with movement. The eyelid exhibits swelling.     Review of Systems  Constitutional: Negative for chills and fever.  HENT: Positive for congestion, rhinorrhea and sore throat. Negative for trouble swallowing.   Respiratory: Positive for cough. Negative for chest tightness, shortness of breath and wheezing.   Cardiovascular: Negative for chest pain and palpitations.  Gastrointestinal: Negative for abdominal pain, diarrhea, nausea and vomiting.  Allergic/Immunologic: Negative for environmental allergies.    Patient Active Problem List   Diagnosis Date Noted  . Hyperlipidemia, mild 04/29/2017  . Anxiety 07/22/2014  . Abnormal finding on thyroid function test 07/22/2014  . Essential (primary) hypertension 07/22/2014  . Anemia, iron deficiency 07/22/2014  . Local edema 07/22/2014  . LBP (low back pain) 07/22/2014    Prior to Admission medications   Medication Sig Start Date  End Date Taking? Authorizing Provider  acetaminophen (TYLENOL) 325 MG tablet Take 650 mg by mouth once.   Yes [provider]  lisinopril-hydrochlorothiazide (PRINZIDE,ZESTORETIC) 20-12.5 MG tablet TAKE 1 TABLET BY MOUTH DAILY 10/20/16  Yes Glean Hess, MD  loratadine (CLARITIN) 10 MG tablet Take 1 tablet (10 mg total) by mouth daily. 10/30/16  Yes Glean Hess, MD  Multiple Vitamins-Minerals (MULTIVITAMIN WITH MINERALS) tablet Take 1 tablet by mouth.   Yes [provider]    No Known Allergies  Past Surgical History:  Procedure Laterality Date  . c-section  2004  . TONSILLECTOMY    . TUBAL LIGATION  2016    Social History   Tobacco Use  . Smoking status: Current Every Day Smoker    Packs/day: 0.50    Years: 15.00    Pack years: 7.50    Types: Cigarettes  . Smokeless tobacco: Never Used  Substance Use Topics  . Alcohol use: No    Alcohol/week: 0.0 oz  . Drug use: No     Medication list has been reviewed and updated.  PHQ 2/9 Scores 10/30/2016  PHQ - 2 Score 0    Physical Exam  Constitutional: She is oriented to person, place, and time. She appears well-developed. No distress.  HENT:  Head: Normocephalic and atraumatic.  Right Ear: Tympanic membrane and ear canal normal.  Left Ear: Tympanic membrane and ear canal normal.  Nose: Right sinus exhibits maxillary sinus tenderness. Right sinus exhibits no frontal sinus tenderness. Left sinus exhibits maxillary sinus tenderness. Left sinus exhibits no frontal sinus tenderness.  Mouth/Throat: No posterior oropharyngeal edema or posterior oropharyngeal erythema.  Eyes: EOM are normal. Right eye exhibits chemosis. Left eye exhibits chemosis. Right conjunctiva is injected. Left conjunctiva is injected.  Neck: Normal range of motion. Neck supple.  Cardiovascular: Normal rate, regular rhythm and normal heart sounds.  Pulmonary/Chest: Effort normal and breath sounds normal. No respiratory distress.    Musculoskeletal: Normal range of motion.  Neurological: She is alert and oriented to person, place, and time.  Skin: Skin is warm and dry. No rash noted.  Psychiatric: She has a normal mood and affect. Her behavior is normal. Thought content normal.    BP 124/80   Pulse (!) 103   Temp 98.8 F (37.1 C) (Oral)   Ht 5\' 1"  (1.549 m)   Wt 199 lb (90.3 kg)   SpO2 100%   BMI 37.60 kg/m   Assessment and Plan: 1. Viral URI Claritin and decongestants for sx relief Start Zpak tomorrow if worsening  2. Other mucopurulent conjunctivitis of both eyes - neomycin-polymyxin b-dexamethasone (MAXITROL) 3.5-10000-0.1 SUSP; Place 2 drops into both eyes every 6 (six) hours.  Dispense: 5 mL; Refill: 0   Meds ordered this encounter  Medications  . neomycin-polymyxin b-dexamethasone (MAXITROL) 3.5-10000-0.1 SUSP    Sig: Place 2 drops into both eyes every 6 (six) hours.    Dispense:  5 mL    Refill:  0  . azithromycin (ZITHROMAX Z-PAK) 250 MG tablet    Sig: UAD    Dispense:  6 each    Refill:  0    Partially dictated using Editor, commissioning. Any errors are unintentional.  Halina Maidens, MD [facility]  06/18/2017

## 2017-06-18 NOTE — Patient Instructions (Signed)
Claritin or Allegra for symptom relief  Decongestant for congestion

## 2017-08-15 ENCOUNTER — Other Ambulatory Visit: Payer: Self-pay

## 2017-11-12 ENCOUNTER — Other Ambulatory Visit: Payer: Self-pay | Admitting: Internal Medicine

## 2017-11-12 DIAGNOSIS — I10 Essential (primary) hypertension: Secondary | ICD-10-CM

## 2017-11-25 ENCOUNTER — Encounter: Payer: Self-pay | Admitting: Internal Medicine

## 2017-11-25 ENCOUNTER — Ambulatory Visit (INDEPENDENT_AMBULATORY_CARE_PROVIDER_SITE_OTHER): Payer: Commercial Managed Care - PPO | Admitting: Internal Medicine

## 2017-11-25 VITALS — BP 118/78 | HR 111 | Temp 98.7°F | Ht 61.0 in | Wt 190.0 lb

## 2017-11-25 DIAGNOSIS — J0140 Acute pansinusitis, unspecified: Secondary | ICD-10-CM

## 2017-11-25 MED ORDER — PREDNISONE 10 MG PO TABS: 10.0000 mg | ORAL_TABLET | ORAL | 0 refills | Status: AC

## 2017-11-25 MED ORDER — AMOXICILLIN-POT CLAVULANATE 875-125 MG PO TABS: 1.0000 | ORAL_TABLET | Freq: Two times a day (BID) | ORAL | 0 refills | Status: AC

## 2017-11-25 NOTE — Progress Notes (Signed)
Date:  11/25/2017   Name:  Theresa Bowen   DOB:  1981-02-02   MRN:  [id number]   Chief Complaint: Sinus Problem (Headache with ear pain. SOB. Cough with yellow mucous. Body aches. Sinus pain in face. Sneezing. No fever. Started 1 week ago. )  Sinusitis  This is a new problem. The current episode started in the past 7 days. The problem is unchanged. There has been no fever. The pain is moderate. Associated symptoms include congestion, coughing, ear pain, headaches, sinus pressure and a sore throat. Pertinent negatives include no chills. Past treatments include acetaminophen. The treatment provided mild relief.    Review of Systems  Constitutional: Positive for fatigue. Negative for chills and fever.  HENT: Positive for congestion, ear pain, sinus pressure and sore throat.   Respiratory: Positive for cough and wheezing. Negative for chest tightness.   Cardiovascular: Negative for chest pain and palpitations.  Neurological: Positive for headaches. Negative for dizziness.    Patient Active Problem List   Diagnosis Date Noted  . Hyperlipidemia, mild 04/29/2017  . Anxiety 07/22/2014  . Abnormal finding on thyroid function test 07/22/2014  . Essential (primary) hypertension 07/22/2014  . Anemia, iron deficiency 07/22/2014  . Local edema 07/22/2014  . LBP (low back pain) 07/22/2014    No Known Allergies  Past Surgical History:  Procedure Laterality Date  . c-section  2004  . TONSILLECTOMY    . TUBAL LIGATION  2016    Social History   Tobacco Use  . Smoking status: Current Every Day Smoker    Packs/day: 0.50    Years: 15.00    Pack years: 7.50    Types: Cigarettes  . Smokeless tobacco: Never Used  Substance Use Topics  . Alcohol use: No    Alcohol/week: 0.0 standard drinks  . Drug use: No     Medication list has been reviewed and updated.  Current Meds  Medication Sig  . acetaminophen (TYLENOL) 325 MG tablet Take 650 mg by mouth once.  Marland Kitchen  lisinopril-hydrochlorothiazide (PRINZIDE,ZESTORETIC) 20-12.5 MG tablet TAKE 1 TABLET BY MOUTH DAILY  . loratadine (CLARITIN) 10 MG tablet Take 1 tablet (10 mg total) by mouth daily.  . Multiple Vitamins-Minerals (MULTIVITAMIN WITH MINERALS) tablet Take 1 tablet by mouth.  . neomycin-polymyxin b-dexamethasone (MAXITROL) 3.5-10000-0.1 SUSP Place 2 drops into both eyes every 6 (six) hours.    PHQ 2/9 Scores 11/25/2017 10/30/2016  PHQ - 2 Score 0 0    Physical Exam  Constitutional: She is oriented to person, place, and time. She appears well-developed and well-nourished.  HENT:  Right Ear: External ear and ear canal normal. Tympanic membrane is not erythematous and not retracted.  Left Ear: External ear and ear canal normal. Tympanic membrane is not erythematous and not retracted.  Nose: Right sinus exhibits maxillary sinus tenderness and frontal sinus tenderness. Left sinus exhibits maxillary sinus tenderness and frontal sinus tenderness.  Mouth/Throat: Uvula is midline and mucous membranes are normal. No oral lesions. Posterior oropharyngeal erythema present. No oropharyngeal exudate.  Cardiovascular: Normal rate, regular rhythm and normal heart sounds.  Pulmonary/Chest: Effort normal and breath sounds normal. She has no wheezes. She has no rales.  Lymphadenopathy:    She has no cervical adenopathy.  Neurological: She is alert and oriented to person, place, and time.    BP 118/78 (BP Location: Right Arm, Patient Position: Sitting, Cuff Size: Normal)   Pulse (!) 111   Temp 98.7 F (37.1 C) (Oral)   Ht 5\' 1"  (1.549  m)   Wt 190 lb (86.2 kg)   SpO2 100%   BMI 35.90 kg/m   Assessment and Plan: 1. Acute non-recurrent pansinusitis Continue sudafed, increase fluids - predniSONE (DELTASONE) 10 MG tablet; Take 1 tablet (10 mg total) by mouth as directed for 6 days. Take 6,5,4,3,2,1 then stop  Dispense: 21 tablet; Refill: 0 - amoxicillin-clavulanate (AUGMENTIN) 875-125 MG tablet; Take 1 tablet  by mouth 2 (two) times daily for 10 days.  Dispense: 20 tablet; Refill: 0   Partially dictated using Editor, commissioning. Any errors are unintentional.  Halina Maidens, MD [facility]  11/25/2017

## 2017-11-27 ENCOUNTER — Encounter: Payer: Self-pay | Admitting: Internal Medicine

## 2018-04-10 DIAGNOSIS — N760 Acute vaginitis: Secondary | ICD-10-CM | POA: Diagnosis not present

## 2018-04-10 DIAGNOSIS — Z01419 Encounter for gynecological examination (general) (routine) without abnormal findings: Secondary | ICD-10-CM | POA: Diagnosis not present

## 2018-04-10 DIAGNOSIS — N939 Abnormal uterine and vaginal bleeding, unspecified: Secondary | ICD-10-CM | POA: Diagnosis not present

## 2018-04-17 ENCOUNTER — Ambulatory Visit (INDEPENDENT_AMBULATORY_CARE_PROVIDER_SITE_OTHER): Payer: Commercial Managed Care - PPO

## 2018-04-17 ENCOUNTER — Ambulatory Visit
Admission: EM | Admit: 2018-04-17 | Discharge: 2018-04-17 | Disposition: A | Discharge status: Home / Self Care | Payer: Commercial Managed Care - PPO | Attending: Family Medicine | Admitting: Family Medicine

## 2018-04-17 ENCOUNTER — Other Ambulatory Visit: Payer: Self-pay

## 2018-04-17 ENCOUNTER — Encounter: Payer: Self-pay | Admitting: Emergency Medicine

## 2018-04-17 DIAGNOSIS — S63502A Unspecified sprain of left wrist, initial encounter: Secondary | ICD-10-CM | POA: Diagnosis not present

## 2018-04-17 DIAGNOSIS — W19XXXA Unspecified fall, initial encounter: Secondary | ICD-10-CM

## 2018-04-17 NOTE — Discharge Instructions (Addendum)
Apply ice 20 minutes out of every 2 hours 4-5 times daily for comfort.  Elevate your hand above the level of your heart sufficiently to control swelling and discomfort.  Wear the splint for comfort; wean as tolerated

## 2018-04-17 NOTE — ED Triage Notes (Signed)
Patient states she slipped on the ice this morning and fell on her left wrist.

## 2018-04-17 NOTE — ED Provider Notes (Signed)
[facility]    CSN: [id number] Arrival date & time: 04/17/18  0109     History   Chief Complaint Chief Complaint  Patient presents with  . Fall  . Wrist Pain    HPI Theresa Bowen is a 38 y.o. female.   HPI   -year-old right-hand-dominant female presents today after she fell at home landing on her outstretched left hand.  He has pain over the distal radius.  No bruising, ecchymosis deformity noticed.  Is fairly good range of motion and although she complains of hip esthesia to light touch does not follow a dermatomal distribution        Past Medical History:  Diagnosis Date  . Hypertension     Patient Active Problem List   Diagnosis Date Noted  . Hyperlipidemia, mild 04/29/2017  . Anxiety 07/22/2014  . Abnormal finding on thyroid function test 07/22/2014  . Essential (primary) hypertension 07/22/2014  . Anemia, iron deficiency 07/22/2014  . Local edema 07/22/2014  . LBP (low back pain) 07/22/2014    Past Surgical History:  Procedure Laterality Date  . c-section  2004  . TONSILLECTOMY    . TUBAL LIGATION  2016    OB History    Gravida  4   Para  1   Term  1   Preterm      AB  1   Living  1     SAB      TAB      Ectopic  1   Multiple      Live Births               Home Medications    Prior to Admission medications   Medication Sig Start Date End Date Taking? Authorizing Provider  acetaminophen (TYLENOL) 325 MG tablet Take 650 mg by mouth once.   Yes [provider]  lisinopril-hydrochlorothiazide (PRINZIDE,ZESTORETIC) 20-12.5 MG tablet TAKE 1 TABLET BY MOUTH DAILY 11/12/17  Yes Glean Hess, MD  metoprolol succinate (TOPROL-XL) 25 MG 24 hr tablet  01/29/18  Yes [provider]  Multiple Vitamins-Minerals (MULTIVITAMIN WITH MINERALS) tablet Take 1 tablet by mouth.   Yes [provider]  norethindrone (AYGESTIN) 5 MG tablet Take by mouth. 04/15/18 06/14/18 Yes [provider]     Family History Family History  Problem Relation Age of Onset  . CAD Father   . CAD Mother   . Atrial fibrillation Mother   . Sudden Cardiac Death Brother   . Stroke Brother 66  . Sudden Cardiac Death Brother 52    Social History Social History   Tobacco Use  . Smoking status: Current Every Day Smoker    Packs/day: 0.50    Years: 15.00    Pack years: 7.50    Types: Cigarettes  . Smokeless tobacco: Never Used  Substance Use Topics  . Alcohol use: No    Alcohol/week: 0.0 standard drinks  . Drug use: No     Allergies   Patient has no known allergies.   Review of Systems Review of Systems  Constitutional: Positive for activity change. Negative for appetite change, chills, fatigue and fever.  Musculoskeletal: Positive for myalgias.  All other systems reviewed and are negative.    Physical Exam Triage Vital Signs ED Triage Vitals  Enc Vitals Group     BP 04/17/18 0948 118/82     Pulse Rate 04/17/18 0948 89     Resp 04/17/18 0948 18     Temp 04/17/18 0948  98.5 F (36.9 C)     Temp Source 04/17/18 0948 Oral     SpO2 04/17/18 0948 100 %     Weight 04/17/18 0946 189 lb (85.7 kg)     Height 04/17/18 0946 5\' 1"  (1.549 m)     Head Circumference --      Peak Flow --      Pain Score 04/17/18 0945 10     Pain Loc --      Pain Edu? --      Excl. in [place]? --    No data found.  Updated Vital Signs BP 118/82 (BP Location: Right Arm)   Pulse 89   Temp 98.5 F (36.9 C) (Oral)   Resp 18   Ht 5\' 1"  (1.549 m)   Wt 189 lb (85.7 kg)   LMP 03/28/2018   SpO2 100%   BMI 35.71 kg/m   Visual Acuity Right Eye Distance:   Left Eye Distance:   Bilateral Distance:    Right Eye Near:   Left Eye Near:    Bilateral Near:     Physical Exam Vitals signs and nursing note reviewed.  Constitutional:      General: She is not in acute distress.    Appearance: Normal appearance. She is not ill-appearing, toxic-appearing or diaphoretic.  HENT:     Head: Normocephalic and  atraumatic.     Nose: Nose normal.     Mouth/Throat:     Mouth: Mucous membranes are moist.  Eyes:     General:        Right eye: No discharge.        Left eye: No discharge.     Conjunctiva/sclera: Conjunctivae normal.  Neck:     Musculoskeletal: Normal range of motion and neck supple.  Musculoskeletal: Normal range of motion.        General: Tenderness and signs of injury present. No swelling or deformity.     Comments: Exam of the left wrist shows no deformity swelling ecchymosis or erythema.  She is tender along the distal radius and proximal carpals.  She has no snuffbox tenderness.  There is full pronation supination flexion extension with encouragement.  Sensation is nondermatomal to hip esthesia.  Skin:    General: Skin is warm and dry.  Neurological:     General: No focal deficit present.     Mental Status: She is alert and oriented to person, place, and time.  Psychiatric:        Mood and Affect: Mood normal.        Behavior: Behavior normal.        Thought Content: Thought content normal.        Judgment: Judgment normal.      UC Treatments / Results  Labs (all labs ordered are listed, but only abnormal results are displayed) Labs Reviewed - No data to display  EKG None  Radiology Dg Wrist Complete Left  Result Date: 04/17/2018 CLINICAL DATA:  Status post fall with left wrist pain. EXAM: LEFT WRIST - COMPLETE 3+ VIEW COMPARISON:  None. FINDINGS: There is no evidence of fracture or dislocation. There is no evidence of arthropathy or other focal bone abnormality. Soft tissues are unremarkable. IMPRESSION: Negative. Electronically Signed   By: Abelardo Diesel M.D.   On: 04/17/2018 10:21    Procedures Procedures (including critical care time)  Medications Ordered in UC Medications - No data to display  Initial Impression / Assessment and Plan / UC Course  I have reviewed  the triage vital signs and the nursing notes.  Pertinent labs & imaging results that were  available during my care of the patient were reviewed by me and considered in my medical decision making (see chart for details).   I reviewed the x-rays with the patient.  She has a sprain of the wrist area.  Place her into a splint for comfort.  She may wean as tolerated.  Is ice and elevation to control pain and swelling.  If she is not improving she should follow-up with [facility].   Final Clinical Impressions(s) / UC Diagnoses   Final diagnoses:  Sprain of left wrist, initial encounter     Discharge Instructions     Apply ice 20 minutes out of every 2 hours 4-5 times daily for comfort.  Elevate your hand above the level of your heart sufficiently to control swelling and discomfort.  Wear the splint for comfort; wean as tolerated    ED Prescriptions    None     Controlled Substance Prescriptions [place] Controlled Substance Registry consulted? Not Applicable   Lorin Picket, PA-C 04/17/18 1051

## 2018-04-26 ENCOUNTER — Other Ambulatory Visit: Payer: Self-pay | Admitting: Internal Medicine

## 2018-04-29 ENCOUNTER — Encounter: Payer: 59 | Admitting: Internal Medicine

## 2018-05-05 ENCOUNTER — Encounter: Payer: Self-pay | Admitting: Internal Medicine

## 2018-05-13 ENCOUNTER — Other Ambulatory Visit: Payer: Self-pay | Admitting: Internal Medicine

## 2018-05-13 DIAGNOSIS — I10 Essential (primary) hypertension: Secondary | ICD-10-CM

## 2018-07-25 DIAGNOSIS — H9202 Otalgia, left ear: Secondary | ICD-10-CM | POA: Diagnosis not present

## 2018-07-25 DIAGNOSIS — J019 Acute sinusitis, unspecified: Secondary | ICD-10-CM | POA: Diagnosis not present

## 2018-08-13 DIAGNOSIS — M79672 Pain in left foot: Secondary | ICD-10-CM | POA: Diagnosis not present

## 2018-08-13 DIAGNOSIS — M84375A Stress fracture, left foot, initial encounter for fracture: Secondary | ICD-10-CM | POA: Diagnosis not present

## 2018-11-12 ENCOUNTER — Other Ambulatory Visit: Payer: Self-pay | Admitting: Internal Medicine

## 2018-11-12 DIAGNOSIS — I10 Essential (primary) hypertension: Secondary | ICD-10-CM

## 2018-12-28 DIAGNOSIS — J309 Allergic rhinitis, unspecified: Secondary | ICD-10-CM | POA: Insufficient documentation

## 2018-12-28 DIAGNOSIS — H9202 Otalgia, left ear: Secondary | ICD-10-CM | POA: Insufficient documentation

## 2018-12-28 DIAGNOSIS — H9190 Unspecified hearing loss, unspecified ear: Secondary | ICD-10-CM | POA: Diagnosis not present

## 2018-12-28 DIAGNOSIS — R490 Dysphonia: Secondary | ICD-10-CM | POA: Insufficient documentation

## 2018-12-28 DIAGNOSIS — M542 Cervicalgia: Secondary | ICD-10-CM | POA: Diagnosis not present

## 2018-12-28 DIAGNOSIS — K219 Gastro-esophageal reflux disease without esophagitis: Secondary | ICD-10-CM | POA: Diagnosis not present

## 2018-12-28 HISTORY — DX: Otalgia, left ear: H92.02

## 2019-02-05 ENCOUNTER — Other Ambulatory Visit: Payer: Self-pay

## 2019-02-05 DIAGNOSIS — Z20822 Contact with and (suspected) exposure to covid-19: Secondary | ICD-10-CM

## 2019-02-07 ENCOUNTER — Telehealth: Payer: Self-pay

## 2019-02-07 LAB — NOVEL CORONAVIRUS, NAA: SARS-CoV-2, NAA: NOT DETECTED

## 2019-02-07 NOTE — Telephone Encounter (Signed)
Pt  called for covid results -advised results are not back.  

## 2019-04-01 ENCOUNTER — Encounter: Payer: Self-pay | Admitting: Internal Medicine

## 2019-04-06 DIAGNOSIS — Z03818 Encounter for observation for suspected exposure to other biological agents ruled out: Secondary | ICD-10-CM | POA: Diagnosis not present

## 2019-04-07 ENCOUNTER — Ambulatory Visit (INDEPENDENT_AMBULATORY_CARE_PROVIDER_SITE_OTHER): Payer: Commercial Managed Care - PPO | Admitting: Internal Medicine

## 2019-04-07 ENCOUNTER — Other Ambulatory Visit: Payer: Self-pay

## 2019-04-07 ENCOUNTER — Encounter: Payer: Self-pay | Admitting: Internal Medicine

## 2019-04-07 VITALS — BP 140/78 | HR 89 | Temp 97.8°F | Ht 61.0 in | Wt 201.0 lb

## 2019-04-07 DIAGNOSIS — F321 Major depressive disorder, single episode, moderate: Secondary | ICD-10-CM | POA: Diagnosis not present

## 2019-04-07 DIAGNOSIS — J0140 Acute pansinusitis, unspecified: Secondary | ICD-10-CM

## 2019-04-07 MED ORDER — FLUTICASONE PROPIONATE 50 MCG/ACT NA SUSP: 2.0000 | Freq: Every day | NASAL | 6 refills | Status: DC

## 2019-04-07 MED ORDER — AMOXICILLIN-POT CLAVULANATE 875-125 MG PO TABS: 1.0000 | ORAL_TABLET | Freq: Two times a day (BID) | ORAL | 0 refills | Status: DC

## 2019-04-07 MED ORDER — IBUPROFEN 400 MG PO TABS: 400.0000 mg | ORAL_TABLET | Freq: Three times a day (TID) | ORAL | 0 refills | Status: DC | PRN

## 2019-04-07 MED ORDER — ESCITALOPRAM OXALATE 10 MG PO TABS: 10.0000 mg | ORAL_TABLET | Freq: Every day | ORAL | 3 refills | Status: DC

## 2019-04-07 NOTE — Progress Notes (Signed)
Date:  04/07/2019   Name:  Theresa Bowen   DOB:  21-Dec-1980   MRN:  [id number]   Chief Complaint: Ear Pain (Sharp pains in both ears. Headache on the front of her forehead. Sinus drainage. X 1 week.) and Anxiety (PHQ9- 21 GAD7- 21 . Started with in this last year. )  Sinusitis This is a new problem. The current episode started in the past 7 days. There has been no fever. Associated symptoms include congestion, coughing, ear pain, headaches, shortness of breath and sinus pressure. Pertinent negatives include no diaphoresis or sore throat. Treatments tried: using flonase intermittently only.  Depression        This is a new problem.  Episode onset: over the past year.   The onset quality is gradual.   The problem occurs daily.  The problem has been gradually worsening since onset.  Associated symptoms include decreased concentration, hopelessness, restlessness, decreased interest, appetite change (overeating), headaches and sad.  Associated symptoms include no fatigue and no suicidal ideas.     Exacerbated by: marital issues, covid stress, working from home with children to be managed.  Past treatments include nothing.   Lab Results  Component Value Date   CREATININE 0.53 04/28/2017   BUN 8 04/28/2017   NA 135 04/28/2017   K 3.0 (L) 04/28/2017   CL 99 (L) 04/28/2017   CO2 26 04/28/2017   Lab Results  Component Value Date   CHOL 224 (H) 04/28/2017   HDL 53 04/28/2017   LDLCALC 158 (H) 04/28/2017   TRIG 67 04/28/2017   CHOLHDL 4.2 04/28/2017   Lab Results  Component Value Date   TSH 0.879 04/28/2017   Lab Results  Component Value Date   HGBA1C 5.5 05/19/2017     Review of Systems  Constitutional: Positive for appetite change (overeating). Negative for diaphoresis, fatigue and fever.  HENT: Positive for congestion, ear pain, postnasal drip and sinus pressure. Negative for sore throat and trouble swallowing.   Respiratory: Positive for cough and shortness of breath.  Negative for chest tightness and wheezing.   Cardiovascular: Negative for chest pain, palpitations and leg swelling.  Neurological: Positive for headaches. Negative for dizziness.  Psychiatric/Behavioral: Positive for decreased concentration, depression and sleep disturbance. Negative for suicidal ideas. The patient is not nervous/anxious.     Patient Active Problem List   Diagnosis Date Noted  . Laryngopharyngeal reflux 12/28/2018  . Allergic rhinitis 12/28/2018  . Muscle tension dysphonia 12/28/2018  . Musculoskeletal neck pain 12/28/2018  . Referred otalgia of left ear 12/28/2018  . Hyperlipidemia, mild 04/29/2017  . Anxiety 07/22/2014  . Abnormal finding on thyroid function test 07/22/2014  . Essential (primary) hypertension 07/22/2014  . Anemia, iron deficiency 07/22/2014  . Local edema 07/22/2014  . LBP (low back pain) 07/22/2014    No Known Allergies  Past Surgical History:  Procedure Laterality Date  . c-section  2004  . TONSILLECTOMY    . TUBAL LIGATION  2016    Social History   Tobacco Use  . Smoking status: Current Every Day Smoker    Packs/day: 0.50    Years: 15.00    Pack years: 7.50    Types: Cigarettes  . Smokeless tobacco: Never Used  Substance Use Topics  . Alcohol use: No    Alcohol/week: 0.0 standard drinks  . Drug use: No     Medication list has been reviewed and updated.  Current Meds  Medication Sig  . ibuprofen (ADVIL) 100 MG tablet Take  100 mg by mouth every 6 (six) hours as needed for fever.  Marland Kitchen lisinopril-hydrochlorothiazide (ZESTORETIC) 20-12.5 MG tablet TAKE 1 TABLET BY MOUTH DAILY  . loratadine (CLARITIN) 10 MG tablet Take by mouth.  . metoprolol succinate (TOPROL-XL) 25 MG 24 hr tablet   . Multiple Vitamins-Minerals (MULTIVITAMIN WITH MINERALS) tablet Take 1 tablet by mouth.    PHQ 2/9 Scores 04/07/2019 11/25/2017 10/30/2016  PHQ - 2 Score 4 0 0  PHQ- 9 Score 21 - -    BP Readings from Last 3 Encounters:  04/07/19 140/78    04/17/18 118/82  11/25/17 118/78    Physical Exam Vitals and nursing note reviewed.  Constitutional:      General: She is not in acute distress.    Appearance: She is well-developed.  HENT:     Head: Normocephalic and atraumatic.     Right Ear: Ear canal and external ear normal. Tympanic membrane is not erythematous or retracted.     Left Ear: Ear canal and external ear normal. Tympanic membrane is not erythematous or retracted.     Nose:     Right Sinus: Maxillary sinus tenderness and frontal sinus tenderness present.     Left Sinus: Maxillary sinus tenderness and frontal sinus tenderness present.  Cardiovascular:     Rate and Rhythm: Normal rate and regular rhythm.     Heart sounds: Normal heart sounds.  Pulmonary:     Effort: Pulmonary effort is normal. No respiratory distress.     Breath sounds: Normal breath sounds. No decreased breath sounds, wheezing or rales.  Musculoskeletal:        General: Normal range of motion.     Right lower leg: No edema.     Left lower leg: No edema.  Lymphadenopathy:     Cervical: No cervical adenopathy.  Skin:    General: Skin is warm and dry.     Findings: No rash.  Neurological:     Mental Status: She is alert and oriented to person, place, and time.  Psychiatric:        Attention and Perception: Attention normal.        Mood and Affect: Affect is tearful.        Speech: Speech normal.        Behavior: Behavior normal.        Thought Content: Thought content normal.        Cognition and Memory: Cognition normal.        Judgment: Judgment normal.     Wt Readings from Last 3 Encounters:  04/07/19 201 lb (91.2 kg)  04/17/18 189 lb (85.7 kg)  11/25/17 190 lb (86.2 kg)    BP 140/78   Pulse 89   Temp 97.8 F (36.6 C) (Oral)   Ht 5\' 1"  (1.549 m)   Wt 201 lb (91.2 kg)   SpO2 98%   BMI 37.98 kg/m   Assessment and Plan: 1. Acute non-recurrent pansinusitis Resume Flonase spray daily, advil as needed - amoxicillin-clavulanate  (AUGMENTIN) 875-125 MG tablet; Take 1 tablet by mouth 2 (two) times daily for 10 days.  Dispense: 20 tablet; Refill: 0 - fluticasone (FLONASE) 50 MCG/ACT nasal spray; Place 2 sprays into both nostrils daily.  Dispense: 16 g; Refill: 6 - ibuprofen (ADVIL) 400 MG tablet; Take 1-2 tablets (400-800 mg total) by mouth every 8 (eight) hours as needed.  Dispense: 180 tablet; Refill: 0  2. Moderate single current episode of major depressive disorder ([place]) Moderately severe symptoms without SI/HI and good  insight Will start Lexapro 10 mg daily and follow up in one month; sooner if needed - escitalopram (LEXAPRO) 10 MG tablet; Take 1 tablet (10 mg total) by mouth daily.  Dispense: 30 tablet; Refill: 3   Partially dictated using Editor, commissioning. Any errors are unintentional.  Halina Maidens, MD [facility]  04/07/2019

## 2019-04-07 NOTE — Patient Instructions (Signed)
Use Flonase every day

## 2019-04-09 ENCOUNTER — Other Ambulatory Visit: Payer: Self-pay | Admitting: Internal Medicine

## 2019-04-09 ENCOUNTER — Encounter: Payer: Self-pay | Admitting: Internal Medicine

## 2019-04-09 DIAGNOSIS — J0140 Acute pansinusitis, unspecified: Secondary | ICD-10-CM

## 2019-04-09 MED ORDER — CEFDINIR 300 MG PO CAPS: 300.0000 mg | ORAL_CAPSULE | Freq: Two times a day (BID) | ORAL | 0 refills | Status: AC

## 2019-04-09 NOTE — Telephone Encounter (Signed)
Please advise 

## 2019-04-27 ENCOUNTER — Other Ambulatory Visit
Admission: RE | Admit: 2019-04-27 | Discharge: 2019-04-27 | Disposition: A | Discharge status: Home / Self Care | Payer: Commercial Managed Care - PPO | Attending: Internal Medicine | Admitting: Internal Medicine

## 2019-04-27 ENCOUNTER — Other Ambulatory Visit (HOSPITAL_COMMUNITY)
Admission: RE | Admit: 2019-04-27 | Discharge: 2019-04-27 | Disposition: A | Discharge status: Home / Self Care | Payer: Commercial Managed Care - PPO | Source: Ambulatory Visit | Attending: Internal Medicine | Admitting: Internal Medicine

## 2019-04-27 ENCOUNTER — Other Ambulatory Visit: Payer: Self-pay

## 2019-04-27 ENCOUNTER — Ambulatory Visit (INDEPENDENT_AMBULATORY_CARE_PROVIDER_SITE_OTHER): Payer: Commercial Managed Care - PPO | Admitting: Internal Medicine

## 2019-04-27 ENCOUNTER — Encounter: Payer: Self-pay | Admitting: Internal Medicine

## 2019-04-27 VITALS — BP 112/80 | HR 96 | Temp 98.3°F | Ht 61.0 in | Wt 206.0 lb

## 2019-04-27 DIAGNOSIS — Z124 Encounter for screening for malignant neoplasm of cervix: Secondary | ICD-10-CM | POA: Insufficient documentation

## 2019-04-27 DIAGNOSIS — E785 Hyperlipidemia, unspecified: Secondary | ICD-10-CM

## 2019-04-27 DIAGNOSIS — I10 Essential (primary) hypertension: Secondary | ICD-10-CM

## 2019-04-27 DIAGNOSIS — R946 Abnormal results of thyroid function studies: Secondary | ICD-10-CM

## 2019-04-27 DIAGNOSIS — F321 Major depressive disorder, single episode, moderate: Secondary | ICD-10-CM

## 2019-04-27 DIAGNOSIS — Z0000 Encounter for general adult medical examination without abnormal findings: Secondary | ICD-10-CM | POA: Diagnosis not present

## 2019-04-27 LAB — POCT URINALYSIS DIPSTICK
Bilirubin, UA: NEGATIVE
Blood, UA: NEGATIVE
Glucose, UA: NEGATIVE
Ketones, UA: NEGATIVE
Leukocytes, UA: NEGATIVE
Nitrite, UA: NEGATIVE
Protein, UA: NEGATIVE
Spec Grav, UA: 1.015 (ref 1.010–1.025)
Urobilinogen, UA: 0.2 E.U./dL
pH, UA: 6 (ref 5.0–8.0)

## 2019-04-27 LAB — CBC WITH DIFFERENTIAL/PLATELET
Abs Immature Granulocytes: 0.02 10*3/uL (ref 0.00–0.07)
Basophils Absolute: 0 10*3/uL (ref 0.0–0.1)
Basophils Relative: 1 %
Eosinophils Absolute: 0 10*3/uL (ref 0.0–0.5)
Eosinophils Relative: 1 %
HCT: 27 % — ABNORMAL LOW (ref 36.0–46.0)
Hemoglobin: 7.7 g/dL — ABNORMAL LOW (ref 12.0–15.0)
Immature Granulocytes: 0 %
Lymphocytes Relative: 36 %
Lymphs Abs: 2 10*3/uL (ref 0.7–4.0)
MCH: 17.9 pg — ABNORMAL LOW (ref 26.0–34.0)
MCHC: 28.5 g/dL — ABNORMAL LOW (ref 30.0–36.0)
MCV: 62.9 fL — ABNORMAL LOW (ref 80.0–100.0)
Monocytes Absolute: 0.4 10*3/uL (ref 0.1–1.0)
Monocytes Relative: 8 %
Neutro Abs: 3.1 10*3/uL (ref 1.7–7.7)
Neutrophils Relative %: 54 %
Platelets: 300 10*3/uL (ref 150–400)
RBC: 4.29 MIL/uL (ref 3.87–5.11)
RDW: 17.2 % — ABNORMAL HIGH (ref 11.5–15.5)
WBC: 5.6 10*3/uL (ref 4.0–10.5)
nRBC: 0 % (ref 0.0–0.2)

## 2019-04-27 LAB — LIPID PANEL
Cholesterol: 207 mg/dL — ABNORMAL HIGH (ref 0–200)
HDL: 42 mg/dL (ref >40)
LDL Cholesterol: 139 mg/dL — ABNORMAL HIGH (ref 0–99)
Total CHOL/HDL Ratio: 4.9 RATIO
Triglycerides: 129 mg/dL (ref <150)
VLDL: 26 mg/dL (ref 0–40)

## 2019-04-27 LAB — COMPREHENSIVE METABOLIC PANEL
ALT: 15 U/L (ref 0–44)
AST: 19 U/L (ref 15–41)
Albumin: 3.9 g/dL (ref 3.5–5.0)
Alkaline Phosphatase: 65 U/L (ref 38–126)
Anion gap: 10 (ref 5–15)
BUN: 9 mg/dL (ref 6–20)
CO2: 23 mmol/L (ref 22–32)
Calcium: 9 mg/dL (ref 8.9–10.3)
Chloride: 100 mmol/L (ref 98–111)
Creatinine, Ser: 0.54 mg/dL (ref 0.44–1.00)
GFR calc Af Amer: >60 mL/min (ref >60)
GFR calc non Af Amer: >60 mL/min (ref >60)
Glucose, Bld: 100 mg/dL — ABNORMAL HIGH (ref 70–99)
Potassium: 3.2 mmol/L — ABNORMAL LOW (ref 3.5–5.1)
Sodium: 133 mmol/L — ABNORMAL LOW (ref 135–145)
Total Bilirubin: 0.3 mg/dL (ref 0.3–1.2)
Total Protein: 8.1 g/dL (ref 6.5–8.1)

## 2019-04-27 LAB — TSH: TSH: 1.721 u[IU]/mL (ref 0.350–4.500)

## 2019-04-27 LAB — T4, FREE: Free T4: 0.69 ng/dL (ref 0.61–1.12)

## 2019-04-27 MED ORDER — LISINOPRIL-HYDROCHLOROTHIAZIDE 20-12.5 MG PO TABS: 1.0000 | ORAL_TABLET | Freq: Every day | ORAL | 3 refills | Status: DC

## 2019-04-27 NOTE — Patient Instructions (Addendum)
My Fitness Pal - app for nutrition  Start Lexapro at 1/2 tablet daily for 4-6 days then increase to 1 tablet daily. Follow up with me in 4-6 weeks or as needed

## 2019-04-27 NOTE — Progress Notes (Signed)
Date:  04/27/2019   Name:  Theresa Bowen   DOB:  10/25/1980   MRN:  [id number]   Chief Complaint: Annual Exam (with pap/ breast exam) and Depression (follow up ) Theresa Bowen is a 39 y.o. female who presents today for her Complete Annual Exam. She feels fairly well. She reports exercising some. She reports she is sleeping fairly well.   Pap  04/2016 neg  Immunization History  Administered Date(s) Administered  . Influenza,inj,Quad PF,6+ Mos 11/17/2014, 04/28/2017  . Influenza-Unspecified 01/15/2019  . Tdap 03/29/2014    Hypertension This is a chronic problem. The problem is controlled. Pertinent negatives include no chest pain, headaches, palpitations or shortness of breath. Past treatments include beta blockers, diuretics and ACE inhibitors. The current treatment provides significant improvement.  Depression        This is a chronic problem.The problem is unchanged.  Associated symptoms include no fatigue and no headaches.  Past treatments include SSRIs - Selective serotonin reuptake inhibitors. Patient decided not to take the Lexapro.  However she is still having anxiety and depression symptoms.  She is not exercising and is over-eating, mostly junk food all day long.  Lab Results  Component Value Date   CREATININE 0.53 04/28/2017   BUN 8 04/28/2017   NA 135 04/28/2017   K 3.0 (L) 04/28/2017   CL 99 (L) 04/28/2017   CO2 26 04/28/2017   Lab Results  Component Value Date   CHOL 224 (H) 04/28/2017   HDL 53 04/28/2017   LDLCALC 158 (H) 04/28/2017   TRIG 67 04/28/2017   CHOLHDL 4.2 04/28/2017   Lab Results  Component Value Date   TSH 0.879 04/28/2017   Lab Results  Component Value Date   HGBA1C 5.5 05/19/2017     Review of Systems  Constitutional: Negative for chills, fatigue, fever and unexpected weight change.  HENT: Negative for congestion, hearing loss, tinnitus, trouble swallowing and voice change.   Eyes: Negative for visual disturbance.  Respiratory:  Negative for cough, chest tightness, shortness of breath and wheezing.   Cardiovascular: Negative for chest pain, palpitations and leg swelling.  Gastrointestinal: Negative for abdominal pain, constipation, diarrhea and vomiting.  Endocrine: Negative for polydipsia and polyuria.  Genitourinary: Negative for dysuria, frequency, genital sores, menstrual problem, vaginal bleeding and vaginal discharge.  Musculoskeletal: Negative for arthralgias, gait problem and joint swelling.  Skin: Negative for color change and rash.  Neurological: Negative for dizziness, tremors, light-headedness and headaches.  Hematological: Negative for adenopathy. Does not bruise/bleed easily.  Psychiatric/Behavioral: Positive for depression and dysphoric mood (improved). Negative for sleep disturbance. The patient is not nervous/anxious.     Patient Active Problem List   Diagnosis Date Noted  . Moderate single current episode of major depressive disorder ([place]) 04/07/2019  . Laryngopharyngeal reflux 12/28/2018  . Allergic rhinitis 12/28/2018  . Muscle tension dysphonia 12/28/2018  . Musculoskeletal neck pain 12/28/2018  . Referred otalgia of left ear 12/28/2018  . Hyperlipidemia, mild 04/29/2017  . Anxiety 07/22/2014  . Abnormal finding on thyroid function test 07/22/2014  . Essential (primary) hypertension 07/22/2014  . Anemia, iron deficiency 07/22/2014  . Local edema 07/22/2014  . LBP (low back pain) 07/22/2014    Allergies  Allergen Reactions  . Augmentin [Amoxicillin-Pot Clavulanate] Diarrhea    Past Surgical History:  Procedure Laterality Date  . c-section  2004  . TONSILLECTOMY    . TUBAL LIGATION  2016    Social History   Tobacco Use  . Smoking status:  Current Every Day Smoker    Packs/day: 0.50    Years: 15.00    Pack years: 7.50    Types: Cigarettes  . Smokeless tobacco: Never Used  Substance Use Topics  . Alcohol use: No    Alcohol/week: 0.0 standard drinks  . Drug use: No      Medication list has been reviewed and updated.  Current Meds  Medication Sig  . fluticasone (FLONASE) 50 MCG/ACT nasal spray Place 2 sprays into both nostrils daily.  Marland Kitchen ibuprofen (ADVIL) 400 MG tablet Take 1-2 tablets (400-800 mg total) by mouth every 8 (eight) hours as needed.  Marland Kitchen lisinopril-hydrochlorothiazide (ZESTORETIC) 20-12.5 MG tablet TAKE 1 TABLET BY MOUTH DAILY  . loratadine (CLARITIN) 10 MG tablet Take by mouth.  . metoprolol succinate (TOPROL-XL) 25 MG 24 hr tablet   . Multiple Vitamins-Minerals (MULTIVITAMIN WITH MINERALS) tablet Take 1 tablet by mouth.    PHQ 2/9 Scores 04/27/2019 04/07/2019 11/25/2017 10/30/2016  PHQ - 2 Score 2 4 0 0  PHQ- 9 Score 10 21 - -   GAD 7 : Generalized Anxiety Score 04/27/2019 04/07/2019  Nervous, Anxious, on Edge 2 3  Control/stop worrying 3 3  Worry too much - different things 3 3  Trouble relaxing 3 3  Restless 2 3  Easily annoyed or irritable 3 3  Afraid - awful might happen 1 3  Total GAD 7 Score 17 21  Anxiety Difficulty Somewhat difficult Extremely difficult      BP Readings from Last 3 Encounters:  04/27/19 112/80  04/07/19 140/78  04/17/18 118/82    Physical Exam Vitals and nursing note reviewed.  Constitutional:      General: She is not in acute distress.    Appearance: She is well-developed.  HENT:     Head: Normocephalic and atraumatic.     Right Ear: Tympanic membrane and ear canal normal.     Left Ear: Tympanic membrane and ear canal normal.     Nose:     Right Sinus: No maxillary sinus tenderness.     Left Sinus: No maxillary sinus tenderness.  Eyes:     General: No scleral icterus.       Right eye: No discharge.        Left eye: No discharge.     Conjunctiva/sclera: Conjunctivae normal.  Neck:     Thyroid: No thyromegaly.     Vascular: No carotid bruit.  Cardiovascular:     Rate and Rhythm: Normal rate and regular rhythm.     Pulses: Normal pulses.     Heart sounds: Normal heart sounds.  Pulmonary:      Effort: Pulmonary effort is normal. No respiratory distress.     Breath sounds: No wheezing.  Chest:     Breasts:        Right: No mass, nipple discharge, skin change or tenderness.        Left: No mass, nipple discharge, skin change or tenderness.  Abdominal:     General: Bowel sounds are normal.     Palpations: Abdomen is soft.     Tenderness: There is no abdominal tenderness.  Genitourinary:    Labia:        Right: No tenderness, lesion or injury.        Left: No tenderness, lesion or injury.      Vagina: Normal.     Cervix: Normal.     Uterus: Normal.      Adnexa: Right adnexa normal and left adnexa normal.  Musculoskeletal:        General: Normal range of motion.     Cervical back: Normal range of motion. No erythema.  Lymphadenopathy:     Cervical: No cervical adenopathy.  Skin:    General: Skin is warm and dry.     Findings: No rash.  Neurological:     Mental Status: She is alert and oriented to person, place, and time.     Cranial Nerves: No cranial nerve deficit.     Sensory: No sensory deficit.     Deep Tendon Reflexes: Reflexes are normal and symmetric.  Psychiatric:        Attention and Perception: Attention normal.        Mood and Affect: Mood is anxious.        Speech: Speech normal.        Behavior: Behavior normal.        Thought Content: Thought content normal.        Cognition and Memory: Cognition normal.        Judgment: Judgment normal.     Wt Readings from Last 3 Encounters:  04/27/19 206 lb (93.4 kg)  04/07/19 201 lb (91.2 kg)  04/17/18 189 lb (85.7 kg)    BP 112/80   Pulse 96   Temp 98.3 F (36.8 C) (Oral)   Ht 5\' 1"  (1.549 m)   Wt 206 lb (93.4 kg)   LMP 03/31/2019 (Exact Date)   SpO2 100%   BMI 38.92 kg/m   Assessment and Plan: 1. Annual physical exam Normal exam except for weight Dietary changes discussed; more regular meals and less snacks My Fitness Pal app to track calories and nutrition - POCT urinalysis dipstick  2.  Essential (primary) hypertension Clinically stable exam with well controlled BP on lisinopril hct and metoprolol. Tolerating medications without side effects at this time. Pt to continue current regimen and low sodium diet; benefits of regular exercise as able discussed. - CBC with Differential/Platelet - Comprehensive metabolic panel - lisinopril-hydrochlorothiazide (ZESTORETIC) 20-12.5 MG tablet; Take 1 tablet by mouth daily.  Dispense: 90 tablet; Refill: 3  3. Moderate single current episode of major depressive disorder ([place]) Recommend that she begin Lexapro  4. Hyperlipidemia, mild Check labs and advise - Lipid panel  5. Abnormal finding on thyroid function test - TSH + free T4  6. Encounter for screening for cervical cancer  - Cytology - PAP   Partially dictated using Dragon software. Any errors are unintentional.  Halina Maidens, MD [facility]  04/27/2019

## 2019-04-30 LAB — CYTOLOGY - PAP
Adequacy: ABSENT
Comment: NEGATIVE
Diagnosis: NEGATIVE
High risk HPV: NEGATIVE

## 2019-05-05 ENCOUNTER — Ambulatory Visit: Payer: Commercial Managed Care - PPO | Admitting: Internal Medicine

## 2019-05-06 ENCOUNTER — Telehealth: Payer: Self-pay | Admitting: Internal Medicine

## 2019-05-06 NOTE — Telephone Encounter (Signed)
Please call the patient and find out why she hasn't had her labs done from her recent visit.

## 2019-05-10 NOTE — Telephone Encounter (Signed)
Labs done.  CM

## 2019-05-23 ENCOUNTER — Ambulatory Visit: Payer: Commercial Managed Care - PPO | Attending: Internal Medicine

## 2019-05-23 DIAGNOSIS — Z23 Encounter for immunization: Secondary | ICD-10-CM

## 2019-05-23 NOTE — Progress Notes (Signed)
   Covid-19 Vaccination Clinic  Name:  Theresa Bowen    MRN: [id number] DOB: August 30, 1980  05/23/2019  Theresa Bowen was observed post Covid-19 immunization for 15 minutes without incident. She was provided with Vaccine Information Sheet and instruction to access the V-Safe system.   Theresa Bowen was instructed to call 911 with any severe reactions post vaccine: Marland Kitchen Difficulty breathing  . Swelling of face and throat  . A fast heartbeat  . A bad rash all over body  . Dizziness and weakness   Immunizations Administered    Name Date Dose VIS Date Route   Pfizer COVID-19 Vaccine 05/23/2019 12:50 PM 0.3 mL 02/05/2019 Intramuscular   Manufacturer: [place]   Lot: [id number]   [place]: [id number]

## 2019-06-10 DIAGNOSIS — I10 Essential (primary) hypertension: Secondary | ICD-10-CM | POA: Diagnosis not present

## 2019-06-10 DIAGNOSIS — R079 Chest pain, unspecified: Secondary | ICD-10-CM | POA: Diagnosis not present

## 2019-06-10 DIAGNOSIS — R0602 Shortness of breath: Secondary | ICD-10-CM | POA: Diagnosis not present

## 2019-06-10 DIAGNOSIS — E785 Hyperlipidemia, unspecified: Secondary | ICD-10-CM | POA: Diagnosis not present

## 2019-06-15 ENCOUNTER — Ambulatory Visit: Payer: Commercial Managed Care - PPO | Attending: Internal Medicine

## 2019-06-15 DIAGNOSIS — Z23 Encounter for immunization: Secondary | ICD-10-CM

## 2019-06-15 NOTE — Progress Notes (Signed)
   Covid-19 Vaccination Clinic  Name:  KERSTIN HASELEY    MRN: [id number] DOB: 09-Jul-1980  06/15/2019  Ms. Bertrand was observed post Covid-19 immunization for 15 minutes without incident. She was provided with Vaccine Information Sheet and instruction to access the V-Safe system.   Ms. Vamos was instructed to call 911 with any severe reactions post vaccine: Marland Kitchen Difficulty breathing  . Swelling of face and throat  . A fast heartbeat  . A bad rash all over body  . Dizziness and weakness   Immunizations Administered    Name Date Dose VIS Date Route   Pfizer COVID-19 Vaccine 06/15/2019  4:02 PM 0.3 mL 04/21/2018 Intramuscular   Manufacturer: [place]   Lot: [id number]   [place]: [id number]

## 2019-08-26 ENCOUNTER — Emergency Department: Payer: Commercial Managed Care - PPO

## 2019-08-26 ENCOUNTER — Encounter: Payer: Self-pay | Admitting: Emergency Medicine

## 2019-08-26 ENCOUNTER — Other Ambulatory Visit: Payer: Self-pay

## 2019-08-26 ENCOUNTER — Emergency Department
Admission: EM | Admit: 2019-08-26 | Discharge: 2019-08-26 | Disposition: A | Discharge status: Home / Self Care | Payer: Commercial Managed Care - PPO | Attending: Emergency Medicine | Admitting: Emergency Medicine

## 2019-08-26 DIAGNOSIS — F17210 Nicotine dependence, cigarettes, uncomplicated: Secondary | ICD-10-CM | POA: Diagnosis not present

## 2019-08-26 DIAGNOSIS — M549 Dorsalgia, unspecified: Secondary | ICD-10-CM | POA: Diagnosis present

## 2019-08-26 DIAGNOSIS — M62838 Other muscle spasm: Secondary | ICD-10-CM

## 2019-08-26 DIAGNOSIS — G5603 Carpal tunnel syndrome, bilateral upper limbs: Secondary | ICD-10-CM | POA: Insufficient documentation

## 2019-08-26 DIAGNOSIS — Z79899 Other long term (current) drug therapy: Secondary | ICD-10-CM | POA: Insufficient documentation

## 2019-08-26 DIAGNOSIS — M62830 Muscle spasm of back: Secondary | ICD-10-CM | POA: Insufficient documentation

## 2019-08-26 DIAGNOSIS — I10 Essential (primary) hypertension: Secondary | ICD-10-CM | POA: Diagnosis not present

## 2019-08-26 DIAGNOSIS — Z7951 Long term (current) use of inhaled steroids: Secondary | ICD-10-CM | POA: Diagnosis not present

## 2019-08-26 MED ORDER — LIDOCAINE 5 % EX PTCH: 1.0000 | MEDICATED_PATCH | CUTANEOUS | 0 refills | Status: DC

## 2019-08-26 MED ORDER — CYCLOBENZAPRINE HCL 5 MG PO TABS
ORAL_TABLET | ORAL | 0 refills | Status: DC
Start: 2019-08-26 — End: 2020-10-19

## 2019-08-26 MED ORDER — IBUPROFEN 600 MG PO TABS
600.0000 mg | ORAL_TABLET | Freq: Four times a day (QID) | ORAL | 0 refills | Status: DC | PRN
Start: 2019-08-26 — End: 2020-05-01

## 2019-08-26 NOTE — ED Provider Notes (Signed)
[facility] Emergency Department Provider Note  ____________________________________________  Time seen: Approximately 11:51 AM  I have reviewed the triage vital signs and the nursing notes.   HISTORY  Chief Complaint Back Pain and Numbness    HPI Theresa Bowen is a 39 y.o. 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  Patient states that pain seems to be worsening.  Numbness happens to her hands after she has been typing.  She does not have any numbness now or any numbness when she is not typing.  Her neck and upper back are stiff.  Discomfort is now continuing into the evening after she is off of work so she presented to the emergency department.  Patient states that pain is elicited when she lays down.  She gets a headache when pain happens when she is laying down.  No trauma.  No recent illness.  No fevers, shortness of breath, chest pain.   Past Medical History:  Diagnosis Date  . Hypertension     Patient Active Problem List   Diagnosis Date Noted  . Moderate single current episode of major depressive disorder ([place]) 04/07/2019  . Laryngopharyngeal reflux 12/28/2018  . Allergic rhinitis 12/28/2018  . Muscle tension dysphonia 12/28/2018  . Musculoskeletal neck pain 12/28/2018  . Referred otalgia of left ear 12/28/2018  . Hyperlipidemia, mild 04/29/2017  . Anxiety 07/22/2014  . Abnormal finding on thyroid function test 07/22/2014  . Essential (primary) hypertension 07/22/2014  . Anemia, iron deficiency 07/22/2014  . Local edema 07/22/2014  . LBP (low back pain) 07/22/2014    Past Surgical History:  Procedure Laterality Date  . c-section  2004  . TONSILLECTOMY    . TUBAL LIGATION  2016    Prior to Admission medications   Medication Sig Start Date End Date Taking? Authorizing Provider  cyclobenzaprine (FLEXERIL) 5 MG  tablet Take 1-2 tablets 3 times daily as needed 08/26/19   Laban Emperor, PA-C  fluticasone [facility]) 50 MCG/ACT nasal spray Place 2 sprays into both nostrils daily. 04/07/19   Glean Hess, MD  ibuprofen (ADVIL) 600 MG tablet Take 1 tablet (600 mg total) by mouth every 6 (six) hours as needed. 08/26/19   Laban Emperor, PA-C  lidocaine (LIDODERM) 5 % Place 1 patch onto the skin daily. Remove & Discard patch within 12 hours or as directed by MD 08/26/19   Laban Emperor, PA-C  lisinopril-hydrochlorothiazide (ZESTORETIC) 20-12.5 MG tablet Take 1 tablet by mouth daily. 04/27/19   Glean Hess, MD  loratadine (CLARITIN) 10 MG tablet Take by mouth. 10/30/16   [provider]  metoprolol succinate (TOPROL-XL) 25 MG 24 hr tablet  01/29/18   [provider]  Multiple Vitamins-Minerals (MULTIVITAMIN WITH MINERALS) tablet Take 1 tablet by mouth.    [provider]    Allergies Augmentin [amoxicillin-pot clavulanate]  Family History  Problem Relation Age of Onset  . CAD Father   . CAD Mother   . Atrial fibrillation Mother   . Sudden Cardiac Death Brother   . Stroke Brother 47  . Sudden Cardiac Death Brother 30    Social History Social History   Tobacco Use  . Smoking status: Current Every Day Smoker    Packs/day: 0.50    Years: 15.00    Pack years: 7.50    Types: Cigarettes  . Smokeless tobacco: Never Used  Vaping Use  .  Vaping Use: Never used  Substance Use Topics  . Alcohol use: No    Alcohol/week: 0.0 standard drinks  . Drug use: No     Review of Systems  Constitutional: No fever/chills ENT: No upper respiratory complaints. Cardiovascular: No chest pain. Respiratory: No cough. No SOB. Gastrointestinal: No abdominal pain.  No nausea, no vomiting.  Musculoskeletal: Positive for neck and back pain. Skin: Negative for rash, abrasions, lacerations, ecchymosis. Neurological: Negative for headaches, numbness or  tingling   ____________________________________________   PHYSICAL EXAM:  VITAL SIGNS: ED Triage Vitals  Enc Vitals Group     BP 08/26/19 1019 (!) 126/94     Pulse Rate 08/26/19 1019 (!) 122     Resp 08/26/19 1019 18     Temp 08/26/19 1019 97.9 F (36.6 C)     Temp Source 08/26/19 1019 Oral     SpO2 08/26/19 1019 100 %     Weight --      Height --      Head Circumference --      Peak Flow --      Pain Score 08/26/19 1022 10     Pain Loc --      Pain Edu? --      Excl. in [place]? --      Constitutional: Alert and oriented. Well appearing and in no acute distress. Eyes: Conjunctivae are normal. PERRL. EOMI. Head: Atraumatic. ENT:      Ears:      Nose: No congestion/rhinnorhea.      Mouth/Throat: Mucous membranes are moist.  Neck: No stridor. No cervical spine tenderness to palpation. Cardiovascular: Normal rate, regular rhythm.  Good peripheral circulation. Symmetric radial pulses bilaterally. Respiratory: Normal respiratory effort without tachypnea or retractions. Lungs CTAB. Good air entry to the bases with no decreased or absent breath sounds. Gastrointestinal: Bowel sounds 4 quadrants. Soft and nontender to palpation. No guarding or rigidity. No palpable masses. No distention. Musculoskeletal: Full range of motion to all extremities. No gross deformities appreciated. Tenderness to palpation throughout neck and upper back. Full ROM of bilateral shoulders.  Positive Phalen's and Tinel's. Neurologic:  Normal speech and language. No gross focal neurologic deficits are appreciated.  Skin:  Skin is warm, dry and intact. No rash noted. Psychiatric: Mood and affect are normal. Speech and behavior are normal. Patient exhibits appropriate insight and judgement.   ____________________________________________   LABS (all labs ordered are listed, but only abnormal results are displayed)  Labs Reviewed - No data to  display ____________________________________________  EKG   ____________________________________________  RADIOLOGY Robinette Haines, personally viewed and evaluated these images (plain radiographs) as part of my medical decision making, as well as reviewing the written report by the radiologist.  DG Chest 2 View  Result Date: 08/26/2019 CLINICAL DATA:  Pain EXAM: CHEST - 2 VIEW COMPARISON:  March 03, 2016 FINDINGS: The lungs are clear. The heart size and pulmonary vascularity are normal. No adenopathy. There is slight midthoracic dextroscoliosis. IMPRESSION: Lungs clear.  Cardiac silhouette normal. Electronically Signed   By: Lowella Grip III M.D.   On: 08/26/2019 13:09   DG Cervical Spine 2-3 Views  Result Date: 08/26/2019 CLINICAL DATA:  Cervicalgia and upper extremity numbness EXAM: CERVICAL SPINE - 2-3 VIEW COMPARISON:  May 27, 2011 FINDINGS: Frontal, lateral, and open-mouth odontoid images were obtained. There is no fracture or spondylolisthesis. Prevertebral soft tissues and predental space regions are normal. The disc spaces appear unremarkable. No erosive change. There is mild reversal of lordotic curvature.  Lung apices are clear. IMPRESSION: Reversal of lordotic curvature is likely indicative of a degree of muscle spasm. No fracture or spondylolisthesis. No appreciable arthropathy. Electronically Signed   By: Lowella Grip III M.D.   On: 08/26/2019 13:08    ____________________________________________    PROCEDURES  Procedure(s) performed:    Procedures    Medications - No data to display   ____________________________________________   INITIAL IMPRESSION / ASSESSMENT AND PLAN / ED COURSE  Pertinent labs & imaging results that were available during my care of the patient were reviewed by me and considered in my medical decision making (see chart for details).  Review of the [employer] CSRS was performed in accordance of the [place] prior to dispensing any controlled  drugs.   Patient's diagnosis is consistent with muscle spasm and carpal tunnel.  Vital signs and exam are reassuring.  Chest x-ray negative for acute abnormalities.  Cervical spine x-ray consistent with likely muscle spasm.  I suspect that symptoms are related to patient sitting at a desk for 8 hours and typing for 8 hours/day.  We discussed that patient should speak with primary care about follow-up with physical therapy and/or specialist for ergonomics at work and management.  Patient will be discharged home with prescriptions for Flexeril and Lidoderm. Patient is to follow up with primary care as directed. Patient is given ED precautions to return to the ED for any worsening or new symptoms.  KASEE HANTZ was evaluated in Emergency Department on 08/26/2019 for the symptoms described in the history of present illness. She was evaluated in the context of the global COVID-19 pandemic, which necessitated consideration that the patient might be at risk for infection with the SARS-CoV-2 virus that causes COVID-19. Institutional protocols and algorithms that pertain to the evaluation of patients at risk for COVID-19 are in a state of rapid change based on information released by regulatory bodies including the CDC and federal and state organizations. These policies and algorithms were followed during the patient's care in the ED.   ____________________________________________  FINAL CLINICAL IMPRESSION(S) / ED DIAGNOSES  Final diagnoses:  Muscle spasm  Bilateral carpal tunnel syndrome      NEW MEDICATIONS STARTED DURING THIS VISIT:  ED Discharge Orders         Ordered    cyclobenzaprine (FLEXERIL) 5 MG tablet     Discontinue  Reprint     08/26/19 1415    lidocaine (LIDODERM) 5 %  Every 24 hours     Discontinue  Reprint     08/26/19 1415    ibuprofen (ADVIL) 600 MG tablet  Every 6 hours PRN     Discontinue  Reprint     08/26/19 1415              This chart was dictated using voice  recognition software/Dragon. Despite best efforts to proofread, errors can occur which can change the meaning. Any change was purely unintentional.    Laban Emperor, PA-C 08/26/19 1538    Blake Divine, MD 08/27/19 [phone]

## 2019-08-26 NOTE — ED Triage Notes (Signed)
Pt c/o back pain and numbness to her hands when she has to type all day. Pt states works from home at [employer]. Pt reports has always has some issues like this but has noticed it is getting worse. Pt reports numbness is from wrist to hands bilaterally after typing.

## 2019-08-31 DIAGNOSIS — M542 Cervicalgia: Secondary | ICD-10-CM | POA: Diagnosis not present

## 2019-08-31 DIAGNOSIS — M549 Dorsalgia, unspecified: Secondary | ICD-10-CM | POA: Diagnosis not present

## 2019-09-09 ENCOUNTER — Ambulatory Visit (INDEPENDENT_AMBULATORY_CARE_PROVIDER_SITE_OTHER): Payer: Commercial Managed Care - PPO | Admitting: Internal Medicine

## 2019-09-09 ENCOUNTER — Encounter: Payer: Self-pay | Admitting: Internal Medicine

## 2019-09-09 ENCOUNTER — Other Ambulatory Visit: Payer: Self-pay

## 2019-09-09 VITALS — BP 122/78 | HR 111 | Temp 98.9°F | Ht 61.0 in | Wt 204.0 lb

## 2019-09-09 DIAGNOSIS — M62838 Other muscle spasm: Secondary | ICD-10-CM | POA: Insufficient documentation

## 2019-09-09 DIAGNOSIS — G5603 Carpal tunnel syndrome, bilateral upper limbs: Secondary | ICD-10-CM

## 2019-09-09 MED ORDER — NABUMETONE 500 MG PO TABS: 500.0000 mg | ORAL_TABLET | Freq: Two times a day (BID) | ORAL | 1 refills | Status: DC

## 2019-09-09 NOTE — Patient Instructions (Signed)
Take Relafen in place of ibuprofen  Wear splints on wrists while sleeping  Use heat as needed on neck  Take cyclobenzaprine at bedtime

## 2019-09-09 NOTE — Progress Notes (Signed)
Date:  09/09/2019   Name:  Theresa Bowen   DOB:  [birth year]   MRN:  [id number]   Chief Complaint: Wrist Pain (Needs referral for PT for treatment for carpel tunnel syndrome per [facility] ER. ) and Neck Pain (Was told she will need to PCP to do PT for her neck/ shoulder/ back pain)  Wrist Pain  Pertinent negatives include no fever or numbness.  Neck Pain  This is a new problem. The current episode started more than 1 month ago. The problem occurs constantly. The problem has been unchanged. The pain is present in the right side. The quality of the pain is described as aching and burning. The pain is mild. The symptoms are aggravated by bending and twisting. Associated symptoms include weakness (in hands toward the end of the day). Pertinent negatives include no chest pain, fever, headaches, numbness or trouble swallowing. She has tried NSAIDs, muscle relaxants and heat (muscle relaxants and ice no benefit) for the symptoms.   C spine 08/2019: IMPRESSION: Reversal of lordotic curvature is likely indicative of a degree of muscle spasm. No fracture or spondylolisthesis. No appreciable arthropathy.   Lab Results  Component Value Date   CREATININE 0.54 04/27/2019   BUN 9 04/27/2019   NA 133 (L) 04/27/2019   K 3.2 (L) 04/27/2019   CL 100 04/27/2019   CO2 23 04/27/2019   Lab Results  Component Value Date   CHOL 207 (H) 04/27/2019   HDL 42 04/27/2019   LDLCALC 139 (H) 04/27/2019   TRIG 129 04/27/2019   CHOLHDL 4.9 04/27/2019   Lab Results  Component Value Date   TSH 1.721 04/27/2019   Lab Results  Component Value Date   HGBA1C 5.5 05/19/2017   Lab Results  Component Value Date   WBC 5.6 04/27/2019   HGB 7.7 (L) 04/27/2019   HCT 27.0 (L) 04/27/2019   MCV 62.9 (L) 04/27/2019   PLT 300 04/27/2019   Lab Results  Component Value Date   ALT 15 04/27/2019   AST 19 04/27/2019   ALKPHOS 65 04/27/2019   BILITOT 0.3 04/27/2019     Review of Systems  Constitutional: Negative  for appetite change, fatigue, fever and unexpected weight change.  HENT: Negative for tinnitus and trouble swallowing.   Eyes: Negative for visual disturbance.  Respiratory: Negative for chest tightness and shortness of breath.   Cardiovascular: Negative for chest pain, palpitations and leg swelling.  Gastrointestinal: Negative for abdominal pain.  Musculoskeletal: Positive for arthralgias and neck pain.  Neurological: Positive for weakness (in hands toward the end of the day). Negative for tremors, numbness and headaches.  Psychiatric/Behavioral: Negative for dysphoric mood.    Patient Active Problem List   Diagnosis Date Noted  . Moderate single current episode of major depressive disorder ([place]) 04/07/2019  . Laryngopharyngeal reflux 12/28/2018  . Allergic rhinitis 12/28/2018  . Muscle tension dysphonia 12/28/2018  . Musculoskeletal neck pain 12/28/2018  . Referred otalgia of left ear 12/28/2018  . Hyperlipidemia, mild 04/29/2017  . Anxiety 07/22/2014  . Abnormal finding on thyroid function test 07/22/2014  . Essential (primary) hypertension 07/22/2014  . Anemia, iron deficiency 07/22/2014  . Local edema 07/22/2014  . LBP (low back pain) 07/22/2014    Allergies  Allergen Reactions  . Augmentin [Amoxicillin-Pot Clavulanate] Diarrhea    Past Surgical History:  Procedure Laterality Date  . c-section  2004  . TONSILLECTOMY    . TUBAL LIGATION  2016    Social History   Tobacco  Use  . Smoking status: Current Every Day Smoker    Packs/day: 0.50    Years: 15.00    Pack years: 7.50    Types: Cigarettes  . Smokeless tobacco: Never Used  Vaping Use  . Vaping Use: Never used  Substance Use Topics  . Alcohol use: No    Alcohol/week: 0.0 standard drinks  . Drug use: No     Medication list has been reviewed and updated.  Current Meds  Medication Sig  . cyclobenzaprine (FLEXERIL) 5 MG tablet Take 1-2 tablets 3 times daily as needed  . fluticasone (FLONASE) 50 MCG/ACT  nasal spray Place 2 sprays into both nostrils daily.  Marland Kitchen ibuprofen (ADVIL) 600 MG tablet Take 1 tablet (600 mg total) by mouth every 6 (six) hours as needed.  . lidocaine (LIDODERM) 5 % Place 1 patch onto the skin daily. Remove & Discard patch within 12 hours or as directed by MD  . lisinopril-hydrochlorothiazide (ZESTORETIC) 20-12.5 MG tablet Take 1 tablet by mouth daily.  Marland Kitchen loratadine (CLARITIN) 10 MG tablet Take by mouth.  . metoprolol succinate (TOPROL-XL) 25 MG 24 hr tablet   . Multiple Vitamins-Minerals (MULTIVITAMIN WITH MINERALS) tablet Take 1 tablet by mouth.    PHQ 2/9 Scores 09/09/2019 04/27/2019 04/07/2019 11/25/2017  PHQ - 2 Score 0 2 4 0  PHQ- 9 Score 0 10 21 -    GAD 7 : Generalized Anxiety Score 09/09/2019 04/27/2019 04/07/2019  Nervous, Anxious, on Edge 0 2 3  Control/stop worrying 0 3 3  Worry too much - different things 0 3 3  Trouble relaxing 0 3 3  Restless 0 2 3  Easily annoyed or irritable 0 3 3  Afraid - awful might happen 0 1 3  Total GAD 7 Score 0 17 21  Anxiety Difficulty Not difficult at all Somewhat difficult Extremely difficult    BP Readings from Last 3 Encounters:  09/09/19 122/78  08/26/19 129/78  04/27/19 112/80    Physical Exam Vitals and nursing note reviewed.  Constitutional:      General: She is not in acute distress.    Appearance: She is well-developed.  HENT:     Head: Normocephalic and atraumatic.  Cardiovascular:     Rate and Rhythm: Normal rate and regular rhythm.     Pulses: Normal pulses.     Heart sounds: No murmur heard.   Pulmonary:     Effort: Pulmonary effort is normal. No respiratory distress.     Breath sounds: No wheezing or rhonchi.  Musculoskeletal:     Right wrist: Tenderness present.     Left wrist: Tenderness present.     Cervical back: Tenderness present. Pain with movement present. Decreased range of motion.     Right lower leg: No edema.     Left lower leg: No edema.     Comments: Tinel and Phalen + bilaterally    Lymphadenopathy:     Cervical: No cervical adenopathy.  Skin:    General: Skin is warm and dry.     Findings: No rash.  Neurological:     Mental Status: She is alert and oriented to person, place, and time.  Psychiatric:        Behavior: Behavior normal.        Thought Content: Thought content normal.     Wt Readings from Last 3 Encounters:  09/09/19 204 lb (92.5 kg)  04/27/19 206 lb (93.4 kg)  04/07/19 201 lb (91.2 kg)    BP 122/78 (BP Location:  Right Arm, Patient Position: Sitting, Cuff Size: Large)   Pulse (!) 111   Temp 98.9 F (37.2 C) (Oral)   Ht 5\' 1"  (1.549 m)   Wt 204 lb (92.5 kg)   LMP 08/29/2019 (Exact Date)   SpO2 100%   BMI 38.55 kg/m   Assessment and Plan: 1. Muscle spasms of neck Continue heat and cyclobenzaprine at bedtime Refer to PTx Stop ibuprofen and begin Relafen - nabumetone (RELAFEN) 500 MG tablet; Take 1 tablet (500 mg total) by mouth 2 (two) times daily.  Dispense: 60 tablet; Refill: 1 - Ambulatory referral to Physical Therapy  2. Carpal tunnel syndrome on both sides Wear splints while sleeping Relafen bid - Ambulatory referral to Orthopedic Surgery  FMLA for intermittent leave completed. Partially dictated using Editor, commissioning. Any errors are unintentional.  Halina Maidens, MD [employer]  09/09/2019

## 2019-09-10 ENCOUNTER — Telehealth: Payer: Self-pay | Admitting: Internal Medicine

## 2019-09-10 NOTE — Telephone Encounter (Signed)
Copied from [place] [id number]. Topic: General - Other >> Sep 10, 2019 10:26 AM Hinda Lenis D wrote: Reason for CRM: PT asking if her FMLA paperwork was fax to her jod / please cal her back

## 2019-09-10 NOTE — Telephone Encounter (Signed)
Patient informed this was faxed this AM.   CM

## 2019-11-09 ENCOUNTER — Ambulatory Visit: Admit: 2019-11-09 | Discharge status: Against Medical Advice | Payer: Medicaid Other

## 2019-11-09 DIAGNOSIS — J209 Acute bronchitis, unspecified: Secondary | ICD-10-CM | POA: Diagnosis not present

## 2019-11-09 DIAGNOSIS — J0100 Acute maxillary sinusitis, unspecified: Secondary | ICD-10-CM | POA: Diagnosis not present

## 2019-11-10 ENCOUNTER — Ambulatory Visit: Payer: Self-pay

## 2019-12-24 ENCOUNTER — Other Ambulatory Visit: Payer: Self-pay

## 2019-12-24 ENCOUNTER — Ambulatory Visit (INDEPENDENT_AMBULATORY_CARE_PROVIDER_SITE_OTHER): Payer: Medicaid Other

## 2019-12-24 DIAGNOSIS — Z23 Encounter for immunization: Secondary | ICD-10-CM | POA: Diagnosis not present

## 2020-02-26 ENCOUNTER — Encounter: Payer: Self-pay | Admitting: Emergency Medicine

## 2020-02-26 ENCOUNTER — Other Ambulatory Visit: Payer: Self-pay

## 2020-02-26 ENCOUNTER — Ambulatory Visit
Admission: EM | Admit: 2020-02-26 | Discharge: 2020-02-26 | Disposition: A | Discharge status: Home / Self Care | Payer: 59 | Attending: Family Medicine | Admitting: Family Medicine

## 2020-02-26 DIAGNOSIS — U071 COVID-19: Secondary | ICD-10-CM | POA: Insufficient documentation

## 2020-02-26 DIAGNOSIS — J069 Acute upper respiratory infection, unspecified: Secondary | ICD-10-CM | POA: Insufficient documentation

## 2020-02-26 MED ORDER — PROMETHAZINE-DM 6.25-15 MG/5ML PO SYRP
5.0000 mL | ORAL_SOLUTION | Freq: Four times a day (QID) | ORAL | 0 refills | Status: DC | PRN
Start: 2020-02-26 — End: 2020-05-01

## 2020-02-26 MED ORDER — BENZONATATE 100 MG PO CAPS
200.0000 mg | ORAL_CAPSULE | Freq: Three times a day (TID) | ORAL | 0 refills | Status: DC
Start: 2020-02-26 — End: 2020-05-01

## 2020-02-26 NOTE — ED Triage Notes (Signed)
Patient c/o cough, nasal congestion that started Wednesday. Her son tested positive for COVID this morning.

## 2020-02-26 NOTE — Discharge Instructions (Addendum)
You will need to isolate until the results of your Covid test are back.  If your test is positive you will need to quarantine for 10 days from your symptoms started.  After the 10 days you can break quarantine if your symptoms have improved and you have not run a fever in 24 hours without using Tylenol and ibuprofen.  Use Tylenol and ibuprofen as needed for body aches and fever.  Take the [facility] as needed during the day.  You can have them every 8 hours.  They may give you a numbness to the base of your tongue or metallic taste in her mouth, this is normal.  Use the Promethazine DM cough syrup at bedtime for cough, congestion, and sleep.  This medication will make you drowsy.  If you develop worsening shortness of breath, feel you cannot catch her breath at rest, you cannot speak in full sentences, or you develop bluing around your lips you need to go to the ER for evaluation.

## 2020-02-26 NOTE — ED Provider Notes (Signed)
[facility]    CSN: [id number] Arrival date & time: 02/26/20  1041      History   Chief Complaint Chief Complaint  Patient presents with  . Cough    [phone]   . Nasal Congestion  . Covid Exposure         HPI Theresa Bowen is a 40 y.o. female.   HPI   40 year old female here for evaluation of cough, nasal congestion, and headache.  Patient reports that her symptoms started 3 days ago.  She is also had an associated facial pain, body aches, and shortness of breath.  Patient's child did test positive for Covid this morning.  Patient denies fever, runny nose, wheezing, or GI symptoms.  Patient has had her Covid vaccine but not her booster.  Patient has also had her flu shot.  Past Medical History:  Diagnosis Date  . Hypertension     Patient Active Problem List   Diagnosis Date Noted  . Muscle spasms of neck 09/09/2019  . Carpal tunnel syndrome on both sides 09/09/2019  . Moderate single current episode of major depressive disorder ([place]) 04/07/2019  . Laryngopharyngeal reflux 12/28/2018  . Allergic rhinitis 12/28/2018  . Muscle tension dysphonia 12/28/2018  . Musculoskeletal neck pain 12/28/2018  . Referred otalgia of left ear 12/28/2018  . Hyperlipidemia, mild 04/29/2017  . Anxiety 07/22/2014  . Abnormal finding on thyroid function test 07/22/2014  . Essential (primary) hypertension 07/22/2014  . Anemia, iron deficiency 07/22/2014  . Local edema 07/22/2014  . LBP (low back pain) 07/22/2014    Past Surgical History:  Procedure Laterality Date  . c-section  2004  . TONSILLECTOMY    . TUBAL LIGATION  2016    OB History    Gravida  4   Para  1   Term  1   Preterm      AB  1   Living  1     SAB      IAB      Ectopic  1   Multiple      Live Births               Home Medications    Prior to Admission medications   Medication Sig Start Date End Date Taking? Authorizing Provider  benzonatate (TESSALON) 100 MG  capsule Take 2 capsules (200 mg total) by mouth every 8 (eight) hours. 02/26/20  Yes Margarette Canada, NP  cyclobenzaprine (FLEXERIL) 5 MG tablet Take 1-2 tablets 3 times daily as needed 08/26/19  Yes Laban Emperor, PA-C  fluticasone [facility]) 50 MCG/ACT nasal spray Place 2 sprays into both nostrils daily. 04/07/19  Yes Glean Hess, MD  ibuprofen (ADVIL) 600 MG tablet Take 1 tablet (600 mg total) by mouth every 6 (six) hours as needed. 08/26/19  Yes Laban Emperor, PA-C  lidocaine (LIDODERM) 5 % Place 1 patch onto the skin daily. Remove & Discard patch within 12 hours or as directed by MD 08/26/19  Yes Laban Emperor, PA-C  lisinopril-hydrochlorothiazide (ZESTORETIC) 20-12.5 MG tablet Take 1 tablet by mouth daily. 04/27/19  Yes Glean Hess, MD  metoprolol succinate (TOPROL-XL) 25 MG 24 hr tablet  01/29/18  Yes [provider]  Multiple Vitamins-Minerals (MULTIVITAMIN WITH MINERALS) tablet Take 1 tablet by mouth.   Yes [provider]  promethazine-dextromethorphan (PROMETHAZINE-DM) 6.25-15 MG/5ML syrup Take 5 mLs by mouth 4 (four) times daily as needed. 02/26/20  Yes Margarette Canada, NP    Family History Family History  Problem  Relation Age of Onset  . CAD Father   . CAD Mother   . Atrial fibrillation Mother   . Sudden Cardiac Death Brother   . Stroke Brother 72  . Sudden Cardiac Death Brother 79    Social History Social History   Tobacco Use  . Smoking status: Current Every Day Smoker    Packs/day: 0.50    Years: 15.00    Pack years: 7.50    Types: Cigarettes  . Smokeless tobacco: Never Used  Vaping Use  . Vaping Use: Never used  Substance Use Topics  . Alcohol use: No    Alcohol/week: 0.0 standard drinks  . Drug use: No     Allergies   Augmentin [amoxicillin-pot clavulanate]   Review of Systems Review of Systems  Constitutional: Positive for fever. Negative for activity change and appetite change.  HENT: Positive for congestion, sinus pressure and sinus  pain. Negative for ear pain, rhinorrhea and sore throat.   Respiratory: Positive for cough and shortness of breath. Negative for wheezing.   Gastrointestinal: Negative for diarrhea and vomiting.  Musculoskeletal: Positive for arthralgias and myalgias.  Skin: Negative for rash.  Neurological: Positive for headaches.  Hematological: Negative.   Psychiatric/Behavioral: Negative.      Physical Exam Triage Vital Signs ED Triage Vitals  Enc Vitals Group     BP 02/26/20 1213 132/86     Pulse Rate 02/26/20 1213 94     Resp 02/26/20 1213 16     Temp 02/26/20 1213 98 F (36.7 C)     Temp Source 02/26/20 1213 Oral     SpO2 02/26/20 1213 100 %     Weight 02/26/20 1123 193 lb (87.5 kg)     Height 02/26/20 1123 5\' 1"  (1.549 m)     Head Circumference --      Peak Flow --      Pain Score 02/26/20 1123 5     Pain Loc --      Pain Edu? --      Excl. in [place]? --    No data found.  Updated Vital Signs BP 132/86 (BP Location: Left Arm)   Pulse 94   Temp 98 F (36.7 C) (Oral)   Resp 16   Ht 5\' 1"  (1.549 m)   Wt 193 lb (87.5 kg)   LMP 02/18/2020   SpO2 100%   BMI 36.47 kg/m   Visual Acuity Right Eye Distance:   Left Eye Distance:   Bilateral Distance:    Right Eye Near:   Left Eye Near:    Bilateral Near:     Physical Exam Vitals and nursing note reviewed.  Constitutional:      General: She is not in acute distress.    Appearance: Normal appearance. She is not toxic-appearing.  HENT:     Head: Normocephalic.     Right Ear: Tympanic membrane, ear canal and external ear normal.     Left Ear: Tympanic membrane, ear canal and external ear normal.     Nose: Congestion and rhinorrhea present.     Comments: Nasal mucosa is edematous with clear nasal discharge and mild erythema.    Mouth/Throat:     Mouth: Mucous membranes are moist.     Pharynx: Oropharynx is clear. No oropharyngeal exudate or posterior oropharyngeal erythema.  Cardiovascular:     Rate and Rhythm: Normal rate  and regular rhythm.     Pulses: Normal pulses.     Heart sounds: No murmur heard. No gallop.  Pulmonary:     Effort: Pulmonary effort is normal.     Breath sounds: Normal breath sounds. No wheezing, rhonchi or rales.  Musculoskeletal:     Cervical back: Normal range of motion and neck supple.  Lymphadenopathy:     Cervical: No cervical adenopathy.  Skin:    General: Skin is warm and dry.     Capillary Refill: Capillary refill takes less than 2 seconds.     Findings: No erythema or rash.  Neurological:     General: No focal deficit present.     Mental Status: She is alert and oriented to person, place, and time.  Psychiatric:        Mood and Affect: Mood normal.        Behavior: Behavior normal.        Thought Content: Thought content normal.        Judgment: Judgment normal.      UC Treatments / Results  Labs (all labs ordered are listed, but only abnormal results are displayed) Labs Reviewed  SARS CORONAVIRUS 2 (TAT 6-24 HRS)    EKG   Radiology No results found.  Procedures Procedures (including critical care time)  Medications Ordered in UC Medications - No data to display  Initial Impression / Assessment and Plan / UC Course  I have reviewed the triage vital signs and the nursing notes.  Pertinent labs & imaging results that were available during my care of the patient were reviewed by me and considered in my medical decision making (see chart for details).   For evaluation of possible Covid symptoms and her son tested positive for Covid this morning.  Patient's physical exam reveals upper airway inflammation and clear nasal discharge.  Patient has no cervical lymphadenopathy and lungs are clear to auscultation.  Will discharge patient home with diagnosis of URI pending her Covid test give Promethazine DM cough syrup and Tessalon Perles as needed for cough, have patient use Tylenol and ibuprofen as needed for fever and body aches, and will give ER  precautions.   Final Clinical Impressions(s) / UC Diagnoses   Final diagnoses:  Upper respiratory tract infection, unspecified type     Discharge Instructions     You will need to isolate until the results of your Covid test are back.  If your test is positive you will need to quarantine for 10 days from your symptoms started.  After the 10 days you can break quarantine if your symptoms have improved and you have not run a fever in 24 hours without using Tylenol and ibuprofen.  Use Tylenol and ibuprofen as needed for body aches and fever.  Take the [facility] as needed during the day.  You can have them every 8 hours.  They may give you a numbness to the base of your tongue or metallic taste in her mouth, this is normal.  Use the Promethazine DM cough syrup at bedtime for cough, congestion, and sleep.  This medication will make you drowsy.  If you develop worsening shortness of breath, feel you cannot catch her breath at rest, you cannot speak in full sentences, or you develop bluing around your lips you need to go to the ER for evaluation.    ED Prescriptions    Medication Sig Dispense Auth. Provider   benzonatate (TESSALON) 100 MG capsule Take 2 capsules (200 mg total) by mouth every 8 (eight) hours. 21 capsule Becky [place], NP   promethazine-dextromethorphan (PROMETHAZINE-DM) 6.25-15 MG/5ML syrup Take 5 mLs by  mouth 4 (four) times daily as needed. 118 mL Margarette Canada, NP     PDMP not reviewed this encounter.   Margarette Canada, NP 02/26/20 1244

## 2020-02-27 ENCOUNTER — Encounter: Payer: Self-pay | Admitting: Internal Medicine

## 2020-02-27 LAB — SARS CORONAVIRUS 2 (TAT 6-24 HRS): SARS Coronavirus 2: POSITIVE — AB

## 2020-03-01 ENCOUNTER — Telehealth: Payer: 59 | Admitting: Nurse Practitioner

## 2020-03-01 ENCOUNTER — Telehealth: Payer: Self-pay

## 2020-03-01 DIAGNOSIS — U071 COVID-19: Secondary | ICD-10-CM

## 2020-03-01 MED ORDER — ALBUTEROL SULFATE HFA 108 (90 BASE) MCG/ACT IN AERS
2.0000 | INHALATION_SPRAY | Freq: Four times a day (QID) | RESPIRATORY_TRACT | 0 refills | Status: DC | PRN
Start: 2020-03-01 — End: 2020-06-13

## 2020-03-01 MED ORDER — NAPROXEN 500 MG PO TABS
500.0000 mg | ORAL_TABLET | Freq: Two times a day (BID) | ORAL | 0 refills | Status: DC
Start: 2020-03-01 — End: 2020-10-19

## 2020-03-01 NOTE — Telephone Encounter (Signed)
Please review.  KP

## 2020-03-01 NOTE — Telephone Encounter (Signed)
Spoke to pt told her that she can take tylenol extra strength 1000 MG X3 times a day. Do not take more than 3000 MG per day. Explain to pt that she could not get a infusion at this time. Pt verbalized understanding.

## 2020-03-01 NOTE — Telephone Encounter (Signed)
Reason for pt not to have infusion. Covid infusion is limited to 7 people a day. Pt will have Covid for 7 days by Monday we cannot get her referred for an infusion because they are so limited at this time.   KP

## 2020-03-01 NOTE — Progress Notes (Signed)
E-Visit for Corona Virus Screening  We are sorry you are not feeling well. We are here to help!  You have tested positive for COVID-19, meaning that you were infected with the novel coronavirus and could give the virus to others.  It is vitally important that you stay home so you do not spread it to others.      Please continue isolation at home, for at least 10 days since the start of your symptoms and until you have had 24 hours with no fever (without taking a fever reducer) and with improving of symptoms.  If you have no symptoms but tested positive (or all symptoms resolve after 5 days and you have no fever) you can leave your house but continue to wear a mask around others for an additional 5 days. If you have a fever,continue to stay home until you have had 24 hours of no fever. Most cases improve 5-10 days from onset but we have seen a small number of patients who have gotten worse after the 10 days.  Please be sure to watch for worsening symptoms and remain taking the proper precautions.   Go to the nearest hospital ED for assessment if fever/cough/breathlessness are severe or illness seems like a threat to life.    The following symptoms may appear 2-14 days after exposure: . Fever . Cough . Shortness of breath or difficulty breathing . Chills . Repeated shaking with chills . Muscle pain . Headache . Sore throat . New loss of taste or smell . Fatigue . Congestion or runny nose . Nausea or vomiting . Diarrhea  You have been enrolled in [facility] for COVID-19. Daily you will receive a questionnaire within the [facility] website. Our COVID-19 response team will be monitoring your responses daily.  You can use medication such as A prescription cough medication called Tessalon Perles 100 mg. You may take 1-2 capsules every 8 hours as needed for cough, A prescription inhaler called Albuterol MDI 90 mcg /actuation 2 puffs every 4 hours as needed for shortness of breath,  wheezing, cough and A prescription anti-inflammatory called Naprosyn 500 mg. Take twice daily as needed for fever or body aches for 2 weeks  You have tested positive for Covid but because you are not considered high risk you do not qualify for monoclonal antibody infusion.  Supportive care is all that is needed.  * WORK NOTE IS IN YOUR MY CHART ( I am not yelling , just wanted to make sure you saw note. )   You may also take acetaminophen (Tylenol) as needed for fever.  HOME CARE: . Only take medications as instructed by your medical team. . Drink plenty of fluids and get plenty of rest. . A steam or ultrasonic humidifier can help if you have congestion.   GET HELP RIGHT AWAY IF YOU HAVE EMERGENCY WARNING SIGNS.  Call 911 or proceed to your closest emergency facility if: . You develop worsening high fever. . Trouble breathing . Bluish lips or face . Persistent pain or pressure in the chest . New confusion . Inability to wake or stay awake . You cough up blood. . Your symptoms become more severe . Inability to hold down food or fluids  This list is not all possible symptoms. Contact your medical provider for any symptoms that are severe or concerning to you.    Your e-visit answers were reviewed by a board certified advanced clinical practitioner to complete your personal care plan.  Depending on  the condition, your plan could have included both over the counter or prescription medications.  If there is a problem please reply once you have received a response from your provider.  Your safety is important to [place].  If you have drug allergies check your prescription carefully.    You can use MyChart to ask questions about today's visit, request a non-urgent call back, or ask for a work or school excuse for 24 hours related to this e-Visit. If it has been greater than 24 hours you will need to follow up with your provider, or enter a new e-Visit to address those concerns. You will get an  e-mail in the next two days asking about your experience.  I hope that your e-visit has been valuable and will speed your recovery. Thank you for using e-visits.   5-10 minutes spent reviewing and documenting in chart.

## 2020-03-01 NOTE — Telephone Encounter (Unsigned)
Copied from CRM [id number]. Topic: General - Other >> Mar 01, 2020 11:08 AM Marylen Ponto wrote: Reason for CRM: Pt stated she tested positive for Covid and she would like to speak with Dr. Judithann Graves about possibly getting a Rx for a higher dose of Tylenol than what is offered over the counter due to facial pain. Pt also stated she would like to discuss getting Covid infusion. Cb# [id number]

## 2020-03-03 ENCOUNTER — Telehealth: Payer: 59 | Admitting: Physician Assistant

## 2020-03-03 DIAGNOSIS — J3489 Other specified disorders of nose and nasal sinuses: Secondary | ICD-10-CM | POA: Diagnosis not present

## 2020-03-03 DIAGNOSIS — U071 COVID-19: Secondary | ICD-10-CM

## 2020-03-03 MED ORDER — IBUPROFEN 600 MG PO TABS
600.0000 mg | ORAL_TABLET | Freq: Three times a day (TID) | ORAL | 0 refills | Status: DC | PRN
Start: 2020-03-03 — End: 2020-07-25

## 2020-03-03 NOTE — Progress Notes (Signed)
Hi Theresa Bowen,   I am sorry you are not feeling well. I will send in a prescription for Ibuprofen 600mg . Take this as directed. You can also take Tylenol as directed in addition to Ibuprofen, this should help with the pain.   Providers prescribe antibiotics to treat infections caused by bacteria. Antibiotics are very powerful in treating bacterial infections when they are used properly. To maintain their effectiveness, they should be used only when necessary. Overuse of antibiotics has resulted in the development of superbugs that are resistant to treatment!    After careful review of your answers, I would not recommend an antibiotic for your condition.  Antibiotics are not effective against viruses and therefore should not be used to treat them. Common examples of infections caused by viruses include colds and flu  Unfortunately, symptoms of COVID can last for several weeks.      Please continue isolation at home, for at least 10 days since the start of your symptoms and until you have had 24 hours with no fever (without taking a fever reducer) and with improving of symptoms.  If you have no symptoms but tested positive (or all symptoms resolve after 5 days and you have no fever) you can leave your house but continue to wear a mask around others for an additional 5 days. If you have a fever,continue to stay home until you have had 24 hours of no fever. Most cases improve 5-10 days from onset but we have seen a small number of patients who have gotten worse after the 10 days.  Please be sure to watch for worsening symptoms and remain taking the proper precautions.   Go to the nearest hospital ED for assessment if fever/cough/breathlessness are severe or illness seems like a threat to life.    The following symptoms may appear 2-14 days after exposure: . Fever . Cough . Shortness of breath or difficulty breathing . Chills . Repeated shaking with chills . Muscle pain . Headache . Sore  throat . New loss of taste or smell . Fatigue . Congestion or runny nose . Nausea or vomiting . Diarrhea    HOME CARE: . Only take medications as instructed by your medical team. . Drink plenty of fluids and get plenty of rest. . A steam or ultrasonic humidifier can help if you have congestion.   GET HELP RIGHT AWAY IF YOU HAVE EMERGENCY WARNING SIGNS.  Call 911 or proceed to your closest emergency facility if: . You develop worsening high fever. . Trouble breathing . Bluish lips or face . Persistent pain or pressure in the chest . New confusion . Inability to wake or stay awake . You cough up blood. . Your symptoms become more severe . Inability to hold down food or fluids  This list is not all possible symptoms. Contact your medical provider for any symptoms that are severe or concerning to you.    Your e-visit answers were reviewed by a board certified advanced clinical practitioner to complete your personal care plan.  Depending on the condition, your plan could have included both over the counter or prescription medications.  If there is a problem please reply once you have received a response from your provider.  Your safety is important to [place].  If you have drug allergies check your prescription carefully.    You can use MyChart to ask questions about today's visit, request a non-urgent call back, or ask for a work or school excuse for 24 hours related to  this e-Visit. If it has been greater than 24 hours you will need to follow up with your provider, or enter a new e-Visit to address those concerns. You will get an e-mail in the next two days asking about your experience.  I hope that your e-visit has been valuable and will speed your recovery. Thank you for using e-visits.      Greater than 5 minutes, yet less than 10 minutes of time have been spent researching, coordinating and implementing care for this patient today.

## 2020-04-28 ENCOUNTER — Encounter: Payer: Commercial Managed Care - PPO | Admitting: Internal Medicine

## 2020-05-01 ENCOUNTER — Encounter: Payer: Medicaid Other | Admitting: Internal Medicine

## 2020-05-01 NOTE — Progress Notes (Deleted)
Date:  05/01/2020   Name:  Theresa Bowen   DOB:  07-24-1980   MRN:  [id number]   Chief Complaint: No chief complaint on file.  Theresa Bowen is a 40 y.o. female who presents today for her Complete Annual Exam. She feels {DESC; WELL/FAIRLY WELL/POORLY:18703}. She reports exercising ***. She reports she is sleeping {DESC; WELL/FAIRLY WELL/POORLY:18703}. Breast complaints ***.  Mammogram: due June Pap smear: 04/2019 normal with neg HPV Colonoscopy: none  Immunization History  Administered Date(s) Administered  . Influenza,inj,Quad PF,6+ Mos 11/17/2014, 04/28/2017, 12/24/2019  . Influenza-Unspecified 01/15/2019  . PFIZER(Purple Top)SARS-COV-2 Vaccination 05/23/2019, 06/15/2019  . Tdap 03/29/2014    Hypertension This is a chronic problem. The problem is controlled. Pertinent negatives include no chest pain, headaches, palpitations or shortness of breath. Past treatments include beta blockers, ACE inhibitors and diuretics. The current treatment provides significant improvement.    Lab Results  Component Value Date   CREATININE 0.54 04/27/2019   BUN 9 04/27/2019   NA 133 (L) 04/27/2019   K 3.2 (L) 04/27/2019   CL 100 04/27/2019   CO2 23 04/27/2019   Lab Results  Component Value Date   CHOL 207 (H) 04/27/2019   HDL 42 04/27/2019   LDLCALC 139 (H) 04/27/2019   TRIG 129 04/27/2019   CHOLHDL 4.9 04/27/2019   Lab Results  Component Value Date   TSH 1.721 04/27/2019   Lab Results  Component Value Date   HGBA1C 5.5 05/19/2017   Lab Results  Component Value Date   WBC 5.6 04/27/2019   HGB 7.7 (L) 04/27/2019   HCT 27.0 (L) 04/27/2019   MCV 62.9 (L) 04/27/2019   PLT 300 04/27/2019   Lab Results  Component Value Date   ALT 15 04/27/2019   AST 19 04/27/2019   ALKPHOS 65 04/27/2019   BILITOT 0.3 04/27/2019     Review of Systems  Constitutional: Negative for chills, fatigue and fever.  HENT: Negative for congestion, hearing loss, tinnitus, trouble swallowing  and voice change.   Eyes: Negative for visual disturbance.  Respiratory: Negative for cough, chest tightness, shortness of breath and wheezing.   Cardiovascular: Negative for chest pain, palpitations and leg swelling.  Gastrointestinal: Negative for abdominal pain, constipation, diarrhea and vomiting.  Endocrine: Negative for polydipsia and polyuria.  Genitourinary: Negative for dysuria, frequency, genital sores, vaginal bleeding and vaginal discharge.  Musculoskeletal: Negative for arthralgias, gait problem and joint swelling.  Skin: Negative for color change and rash.  Neurological: Negative for dizziness, tremors, light-headedness and headaches.  Hematological: Negative for adenopathy. Does not bruise/bleed easily.  Psychiatric/Behavioral: Negative for dysphoric mood and sleep disturbance. The patient is not nervous/anxious.     Patient Active Problem List   Diagnosis Date Noted  . Muscle spasms of neck 09/09/2019  . Carpal tunnel syndrome on both sides 09/09/2019  . Moderate single current episode of major depressive disorder ([place]) 04/07/2019  . Laryngopharyngeal reflux 12/28/2018  . Allergic rhinitis 12/28/2018  . Muscle tension dysphonia 12/28/2018  . Musculoskeletal neck pain 12/28/2018  . Referred otalgia of left ear 12/28/2018  . Hyperlipidemia, mild 04/29/2017  . Anxiety 07/22/2014  . Abnormal finding on thyroid function test 07/22/2014  . Essential (primary) hypertension 07/22/2014  . Anemia, iron deficiency 07/22/2014  . Local edema 07/22/2014  . LBP (low back pain) 07/22/2014    Allergies  Allergen Reactions  . Augmentin [Amoxicillin-Pot Clavulanate] Diarrhea    Past Surgical History:  Procedure Laterality Date  . c-section  2004  . TONSILLECTOMY    .  TUBAL LIGATION  2016    Social History   Tobacco Use  . Smoking status: Current Every Day Smoker    Packs/day: 0.50    Years: 15.00    Pack years: 7.50    Types: Cigarettes  . Smokeless tobacco: Never  Used  Vaping Use  . Vaping Use: Never used  Substance Use Topics  . Alcohol use: No    Alcohol/week: 0.0 standard drinks  . Drug use: No     Medication list has been reviewed and updated.  No outpatient medications have been marked as taking for the 05/01/20 encounter (Appointment) with Glean Hess, MD.    [facility] 2/9 Scores 09/09/2019 04/27/2019 04/07/2019 11/25/2017  PHQ - 2 Score 0 2 4 0  PHQ- 9 Score 0 10 21 -    GAD 7 : Generalized Anxiety Score 09/09/2019 04/27/2019 04/07/2019  Nervous, Anxious, on Edge 0 2 3  Control/stop worrying 0 3 3  Worry too much - different things 0 3 3  Trouble relaxing 0 3 3  Restless 0 2 3  Easily annoyed or irritable 0 3 3  Afraid - awful might happen 0 1 3  Total GAD 7 Score 0 17 21  Anxiety Difficulty Not difficult at all Somewhat difficult Extremely difficult    BP Readings from Last 3 Encounters:  02/26/20 132/86  09/09/19 122/78  08/26/19 129/78    Physical Exam Vitals and nursing note reviewed.  Constitutional:      General: She is not in acute distress.    Appearance: She is well-developed.  HENT:     Head: Normocephalic and atraumatic.     Right Ear: Tympanic membrane and ear canal normal.     Left Ear: Tympanic membrane and ear canal normal.     Nose:     Right Sinus: No maxillary sinus tenderness.     Left Sinus: No maxillary sinus tenderness.  Eyes:     General: No scleral icterus.       Right eye: No discharge.        Left eye: No discharge.     Conjunctiva/sclera: Conjunctivae normal.  Neck:     Thyroid: No thyromegaly.     Vascular: No carotid bruit.  Cardiovascular:     Rate and Rhythm: Normal rate and regular rhythm.     Pulses: Normal pulses.     Heart sounds: Normal heart sounds.  Pulmonary:     Effort: Pulmonary effort is normal. No respiratory distress.     Breath sounds: No wheezing.  Chest:  Breasts:     Right: No mass, nipple discharge, skin change or tenderness.     Left: No mass, nipple discharge,  skin change or tenderness.    Abdominal:     General: Bowel sounds are normal.     Palpations: Abdomen is soft.     Tenderness: There is no abdominal tenderness.  Musculoskeletal:     Cervical back: Normal range of motion. No erythema.     Right lower leg: No edema.     Left lower leg: No edema.  Lymphadenopathy:     Cervical: No cervical adenopathy.  Skin:    General: Skin is warm and dry.     Findings: No rash.  Neurological:     Mental Status: She is alert and oriented to person, place, and time.     Cranial Nerves: No cranial nerve deficit.     Sensory: No sensory deficit.     Deep Tendon Reflexes: Reflexes  are normal and symmetric.  Psychiatric:        Attention and Perception: Attention normal.        Mood and Affect: Mood normal.     Wt Readings from Last 3 Encounters:  02/26/20 193 lb (87.5 kg)  09/09/19 204 lb (92.5 kg)  04/27/19 206 lb (93.4 kg)    There were no vitals taken for this visit.  Assessment and Plan:

## 2020-05-24 ENCOUNTER — Other Ambulatory Visit: Payer: Self-pay | Admitting: Internal Medicine

## 2020-05-24 DIAGNOSIS — I10 Essential (primary) hypertension: Secondary | ICD-10-CM

## 2020-05-24 NOTE — Telephone Encounter (Signed)
Refill request for lisinopril-hctz last filled 04/26/20, #30; no valid encounter within last 6 months; no upcoming visited noted; decision tree completed and the pt would like a MyChart appt; no dates located; the pt sees Dr Halina Maidens, [place]; no availability noted; pt transferred to [facility] for scheduling; the pt was also notified a 30 day courtesy refill will be sent and a visit is needed for additional refills; she verbalized understanding. Requested Prescriptions  Pending Prescriptions Disp Refills  . lisinopril-hydrochlorothiazide (ZESTORETIC) 20-12.5 MG tablet [Pharmacy Med Name: LISINOPRIL-HCTZ 20/12.5MG  TABLETS] 30 tablet 0    Sig: TAKE 1 TABLET BY MOUTH DAILY     Cardiovascular:  ACEI + Diuretic Combos Failed - 05/24/2020 10:32 AM      Failed - Na in normal range and within 180 days    Sodium  Date Value Ref Range Status  04/27/2019 133 (L) 135 - 145 mmol/L Final  06/06/2014 138 mmol/L Final    Comment:    135-145 NOTE: New Reference Range  05/03/14          Failed - K in normal range and within 180 days    Potassium  Date Value Ref Range Status  04/27/2019 3.2 (L) 3.5 - 5.1 mmol/L Final  06/06/2014 3.3 (L) mmol/L Final    Comment:    3.5-5.1 NOTE: New Reference Range  05/03/14          Failed - Cr in normal range and within 180 days    Creatinine  Date Value Ref Range Status  06/06/2014 0.48 mg/dL Final    Comment:    0.44-1.00 NOTE: New Reference Range  05/03/14    Creatinine, Ser  Date Value Ref Range Status  04/27/2019 0.54 0.44 - 1.00 mg/dL Final         Failed - Ca in normal range and within 180 days    Calcium  Date Value Ref Range Status  04/27/2019 9.0 8.9 - 10.3 mg/dL Final   Calcium, Total  Date Value Ref Range Status  06/06/2014 8.8 (L) mg/dL Final    Comment:    8.9-10.3 NOTE: New Reference Range  05/03/14          Failed - Valid encounter within last 6 months    Recent Outpatient Visits          8 months ago Muscle  spasms of neck   [facility] Glean Hess, MD   1 year ago Annual physical exam   [facility] Glean Hess, MD   1 year ago Acute non-recurrent pansinusitis   [facility] Glean Hess, MD   2 years ago Acute non-recurrent pansinusitis   [facility] Glean Hess, MD   2 years ago Viral URI   [facility] Glean Hess, MD      Future Appointments            In 2 weeks Glean Hess, MD [facility] [place] - Patient is not pregnant      Passed - Last BP in normal range    BP Readings from Last 1 Encounters:  02/26/20 132/86          Requested Prescriptions  Pending Prescriptions Disp Refills  . lisinopril-hydrochlorothiazide (ZESTORETIC) 20-12.5 MG tablet [Pharmacy Med Name: LISINOPRIL-HCTZ 20/12.5MG  TABLETS] 90 tablet 3    Sig: TAKE 1 TABLET BY MOUTH DAILY     Cardiovascular:  ACEI +  Diuretic Combos Failed - 05/24/2020 10:32 AM      Failed - Na in normal range and within 180 days    Sodium  Date Value Ref Range Status  04/27/2019 133 (L) 135 - 145 mmol/L Final  06/06/2014 138 mmol/L Final    Comment:    135-145 NOTE: New Reference Range  05/03/14          Failed - K in normal range and within 180 days    Potassium  Date Value Ref Range Status  04/27/2019 3.2 (L) 3.5 - 5.1 mmol/L Final  06/06/2014 3.3 (L) mmol/L Final    Comment:    3.5-5.1 NOTE: New Reference Range  05/03/14          Failed - Cr in normal range and within 180 days    Creatinine  Date Value Ref Range Status  06/06/2014 0.48 mg/dL Final    Comment:    0.44-1.00 NOTE: New Reference Range  05/03/14    Creatinine, Ser  Date Value Ref Range Status  04/27/2019 0.54 0.44 - 1.00 mg/dL Final         Failed - Ca in normal range and within 180 days    Calcium  Date Value Ref Range Status  04/27/2019 9.0 8.9 - 10.3 mg/dL Final   Calcium, Total  Date Value Ref Range Status  06/06/2014  8.8 (L) mg/dL Final    Comment:    8.9-10.3 NOTE: New Reference Range  05/03/14          Failed - Valid encounter within last 6 months    Recent Outpatient Visits          8 months ago Muscle spasms of neck   [facility] Glean Hess, MD   1 year ago Annual physical exam   [facility] Glean Hess, MD   1 year ago Acute non-recurrent pansinusitis   [facility] Glean Hess, MD   2 years ago Acute non-recurrent pansinusitis   [facility] Glean Hess, MD   2 years ago Viral URI   [facility] Glean Hess, MD      Future Appointments            In 2 weeks Glean Hess, MD [facility] [place] - Patient is not pregnant      Passed - Last BP in normal range    BP Readings from Last 1 Encounters:  02/26/20 132/86

## 2020-06-13 ENCOUNTER — Other Ambulatory Visit
Admission: RE | Admit: 2020-06-13 | Discharge: 2020-06-13 | Disposition: A | Discharge status: Home / Self Care | Payer: 59 | Attending: Internal Medicine | Admitting: Internal Medicine

## 2020-06-13 ENCOUNTER — Encounter: Payer: Self-pay | Admitting: Internal Medicine

## 2020-06-13 ENCOUNTER — Other Ambulatory Visit: Payer: Self-pay | Admitting: Internal Medicine

## 2020-06-13 ENCOUNTER — Other Ambulatory Visit: Payer: Self-pay

## 2020-06-13 ENCOUNTER — Ambulatory Visit (INDEPENDENT_AMBULATORY_CARE_PROVIDER_SITE_OTHER): Payer: 59 | Admitting: Internal Medicine

## 2020-06-13 VITALS — BP 130/72 | HR 95 | Ht 61.0 in | Wt 200.0 lb

## 2020-06-13 DIAGNOSIS — Z1231 Encounter for screening mammogram for malignant neoplasm of breast: Secondary | ICD-10-CM

## 2020-06-13 DIAGNOSIS — F321 Major depressive disorder, single episode, moderate: Secondary | ICD-10-CM | POA: Diagnosis not present

## 2020-06-13 DIAGNOSIS — R0981 Nasal congestion: Secondary | ICD-10-CM

## 2020-06-13 DIAGNOSIS — E876 Hypokalemia: Secondary | ICD-10-CM

## 2020-06-13 DIAGNOSIS — I10 Essential (primary) hypertension: Secondary | ICD-10-CM

## 2020-06-13 LAB — CBC WITH DIFFERENTIAL/PLATELET
Abs Immature Granulocytes: 0.01 10*3/uL (ref 0.00–0.07)
Basophils Absolute: 0 10*3/uL (ref 0.0–0.1)
Basophils Relative: 0 %
Eosinophils Absolute: 0 10*3/uL (ref 0.0–0.5)
Eosinophils Relative: 0 %
HCT: 29.4 % — ABNORMAL LOW (ref 36.0–46.0)
Hemoglobin: 8.6 g/dL — ABNORMAL LOW (ref 12.0–15.0)
Immature Granulocytes: 0 %
Lymphocytes Relative: 34 %
Lymphs Abs: 1.7 10*3/uL (ref 0.7–4.0)
MCH: 18.9 pg — ABNORMAL LOW (ref 26.0–34.0)
MCHC: 29.3 g/dL — ABNORMAL LOW (ref 30.0–36.0)
MCV: 64.8 fL — ABNORMAL LOW (ref 80.0–100.0)
Monocytes Absolute: 0.4 10*3/uL (ref 0.1–1.0)
Monocytes Relative: 7 %
Neutro Abs: 2.8 10*3/uL (ref 1.7–7.7)
Neutrophils Relative %: 59 %
Platelets: 353 10*3/uL (ref 150–400)
RBC: 4.54 MIL/uL (ref 3.87–5.11)
RDW: 16.5 % — ABNORMAL HIGH (ref 11.5–15.5)
WBC: 4.9 10*3/uL (ref 4.0–10.5)
nRBC: 0 % (ref 0.0–0.2)

## 2020-06-13 LAB — COMPREHENSIVE METABOLIC PANEL
ALT: 15 U/L (ref 0–44)
AST: 19 U/L (ref 15–41)
Albumin: 3.8 g/dL (ref 3.5–5.0)
Alkaline Phosphatase: 63 U/L (ref 38–126)
Anion gap: 8 (ref 5–15)
BUN: 9 mg/dL (ref 6–20)
CO2: 26 mmol/L (ref 22–32)
Calcium: 9 mg/dL (ref 8.9–10.3)
Chloride: 101 mmol/L (ref 98–111)
Creatinine, Ser: 0.5 mg/dL (ref 0.44–1.00)
GFR, Estimated: >60 mL/min (ref >60)
Glucose, Bld: 100 mg/dL — ABNORMAL HIGH (ref 70–99)
Potassium: 3.1 mmol/L — ABNORMAL LOW (ref 3.5–5.1)
Sodium: 135 mmol/L (ref 135–145)
Total Bilirubin: 0.5 mg/dL (ref 0.3–1.2)
Total Protein: 8.2 g/dL — ABNORMAL HIGH (ref 6.5–8.1)

## 2020-06-13 LAB — TSH: TSH: 1.19 u[IU]/mL (ref 0.350–4.500)

## 2020-06-13 MED ORDER — FLUTICASONE PROPIONATE 50 MCG/ACT NA SUSP: 2.0000 | Freq: Every day | NASAL | 6 refills | Status: DC

## 2020-06-13 MED ORDER — POTASSIUM CHLORIDE ER 10 MEQ PO TBCR: 10.0000 meq | EXTENDED_RELEASE_TABLET | Freq: Every day | ORAL | 5 refills | Status: DC

## 2020-06-13 MED ORDER — ALBUTEROL SULFATE HFA 108 (90 BASE) MCG/ACT IN AERS: 2.0000 | INHALATION_SPRAY | Freq: Four times a day (QID) | RESPIRATORY_TRACT | 5 refills | Status: AC | PRN

## 2020-06-13 MED ORDER — LISINOPRIL-HYDROCHLOROTHIAZIDE 20-12.5 MG PO TABS: 1.0000 | ORAL_TABLET | Freq: Every day | ORAL | 5 refills | Status: DC

## 2020-06-13 NOTE — Progress Notes (Signed)
Date:  06/13/2020   Name:  Theresa Bowen   DOB:  01-Apr-1980   MRN:  [id number]   Chief Complaint: Hypertension  Hypertension This is a chronic problem. The problem is controlled. Associated symptoms include palpitations. Pertinent negatives include no chest pain, headaches, neck pain or shortness of breath. Past treatments include ACE inhibitors and diuretics. The current treatment provides significant improvement.  Sinus Problem This is a chronic problem. The problem has been waxing and waning since onset. There has been no fever. Associated symptoms include congestion and sinus pressure. Pertinent negatives include no coughing, headaches, neck pain, shortness of breath or sore throat. Treatments tried: inhaler and flonase.    Lab Results  Component Value Date   CREATININE 0.54 04/27/2019   BUN 9 04/27/2019   NA 133 (L) 04/27/2019   K 3.2 (L) 04/27/2019   CL 100 04/27/2019   CO2 23 04/27/2019   Lab Results  Component Value Date   CHOL 207 (H) 04/27/2019   HDL 42 04/27/2019   LDLCALC 139 (H) 04/27/2019   TRIG 129 04/27/2019   CHOLHDL 4.9 04/27/2019   Lab Results  Component Value Date   TSH 1.721 04/27/2019   Lab Results  Component Value Date   HGBA1C 5.5 05/19/2017   Lab Results  Component Value Date   WBC 5.6 04/27/2019   HGB 7.7 (L) 04/27/2019   HCT 27.0 (L) 04/27/2019   MCV 62.9 (L) 04/27/2019   PLT 300 04/27/2019   Lab Results  Component Value Date   ALT 15 04/27/2019   AST 19 04/27/2019   ALKPHOS 65 04/27/2019   BILITOT 0.3 04/27/2019     Review of Systems  Constitutional: Negative for fatigue and unexpected weight change.  HENT: Positive for congestion and sinus pressure. Negative for nosebleeds, sore throat and voice change.   Eyes: Negative for visual disturbance.  Respiratory: Negative for cough, chest tightness, shortness of breath and wheezing.   Cardiovascular: Positive for palpitations. Negative for chest pain and leg swelling.   Gastrointestinal: Negative for abdominal pain, constipation and diarrhea.  Musculoskeletal: Negative for neck pain.  Neurological: Negative for dizziness, weakness, light-headedness and headaches.    Patient Active Problem List   Diagnosis Date Noted  . Muscle spasms of neck 09/09/2019  . Carpal tunnel syndrome on both sides 09/09/2019  . Moderate single current episode of major depressive disorder ([place]) 04/07/2019  . Laryngopharyngeal reflux 12/28/2018  . Allergic rhinitis 12/28/2018  . Muscle tension dysphonia 12/28/2018  . Musculoskeletal neck pain 12/28/2018  . Referred otalgia of left ear 12/28/2018  . Hyperlipidemia, mild 04/29/2017  . Anxiety 07/22/2014  . Abnormal finding on thyroid function test 07/22/2014  . Essential (primary) hypertension 07/22/2014  . Anemia, iron deficiency 07/22/2014  . Local edema 07/22/2014  . LBP (low back pain) 07/22/2014    Allergies  Allergen Reactions  . Augmentin [Amoxicillin-Pot Clavulanate] Diarrhea    Past Surgical History:  Procedure Laterality Date  . c-section  2004  . TONSILLECTOMY    . TUBAL LIGATION  2016    Social History   Tobacco Use  . Smoking status: Current Every Day Smoker    Packs/day: 0.50    Years: 15.00    Pack years: 7.50    Types: Cigarettes    Start date: 2007  . Smokeless tobacco: Never Used  Vaping Use  . Vaping Use: Never used  Substance Use Topics  . Alcohol use: No    Alcohol/week: 0.0 standard drinks  . Drug  use: No     Medication list has been reviewed and updated.  Current Meds  Medication Sig  . albuterol (VENTOLIN HFA) 108 (90 Base) MCG/ACT inhaler Inhale 2 puffs into the lungs every 6 (six) hours as needed for wheezing or shortness of breath.  . cyclobenzaprine (FLEXERIL) 5 MG tablet Take 1-2 tablets 3 times daily as needed  . fluticasone (FLONASE) 50 MCG/ACT nasal spray Place 2 sprays into both nostrils daily.  Marland Kitchen ibuprofen (ADVIL) 600 MG tablet Take 1 tablet (600 mg total) by  mouth every 8 (eight) hours as needed.  Marland Kitchen lisinopril-hydrochlorothiazide (ZESTORETIC) 20-12.5 MG tablet TAKE 1 TABLET BY MOUTH DAILY  . Multiple Vitamins-Minerals (MULTIVITAMIN WITH MINERALS) tablet Take 1 tablet by mouth.  . naproxen (NAPROSYN) 500 MG tablet Take 1 tablet (500 mg total) by mouth 2 (two) times daily with a meal.  . [DISCONTINUED] metoprolol succinate (TOPROL-XL) 25 MG 24 hr tablet     PHQ 2/9 Scores 06/13/2020 09/09/2019 04/27/2019 04/07/2019  PHQ - 2 Score 0 0 2 4  PHQ- 9 Score 7 0 10 21    GAD 7 : Generalized Anxiety Score 06/13/2020 09/09/2019 04/27/2019 04/07/2019  Nervous, Anxious, on Edge 1 0 2 3  Control/stop worrying 1 0 3 3  Worry too much - different things 1 0 3 3  Trouble relaxing 0 0 3 3  Restless 0 0 2 3  Easily annoyed or irritable 0 0 3 3  Afraid - awful might happen 0 0 1 3  Total GAD 7 Score 3 0 17 21  Anxiety Difficulty Not difficult at all Not difficult at all Somewhat difficult Extremely difficult    BP Readings from Last 3 Encounters:  06/13/20 130/72  02/26/20 132/86  09/09/19 122/78    Physical Exam Vitals and nursing note reviewed.  Constitutional:      General: She is not in acute distress.    Appearance: She is well-developed.  HENT:     Head: Normocephalic and atraumatic.  Neck:     Vascular: No carotid bruit.  Cardiovascular:     Rate and Rhythm: Normal rate and regular rhythm.     Pulses: Normal pulses.     Heart sounds: No murmur heard.   Pulmonary:     Effort: Pulmonary effort is normal. No respiratory distress.     Breath sounds: No wheezing or rhonchi.  Musculoskeletal:     Cervical back: Normal range of motion.     Right lower leg: No edema.     Left lower leg: No edema.  Lymphadenopathy:     Cervical: No cervical adenopathy.  Skin:    General: Skin is warm and dry.     Capillary Refill: Capillary refill takes less than 2 seconds.     Findings: No rash.  Neurological:     General: No focal deficit present.      Mental Status: She is alert and oriented to person, place, and time.  Psychiatric:        Mood and Affect: Mood normal.        Behavior: Behavior normal.     Wt Readings from Last 3 Encounters:  06/13/20 200 lb (90.7 kg)  02/26/20 193 lb (87.5 kg)  09/09/19 204 lb (92.5 kg)    BP 130/72   Pulse 95   Ht 5\' 1"  (1.549 m)   Wt 200 lb (90.7 kg)   LMP 05/14/2020 (Exact Date)   SpO2 100%   BMI 37.79 kg/m   Assessment and Plan:  1. Essential (primary) hypertension Clinically stable exam with well controlled BP. Tolerating medications without side effects at this time. Daily transient palpitations noted - would not recommend any treatment at this time Pt to continue current regimen and low sodium diet; benefits of regular exercise as able discussed. - CBC with Differential/Platelet - Comprehensive metabolic panel - TSH - POCT urinalysis dipstick - lisinopril-hydrochlorothiazide (ZESTORETIC) 20-12.5 MG tablet; Take 1 tablet by mouth daily.  Dispense: 30 tablet; Refill: 5  2. Nasal sinus congestion Persistent URI sx since Covid infection - albuterol (VENTOLIN HFA) 108 (90 Base) MCG/ACT inhaler; Inhale 2 puffs into the lungs every 6 (six) hours as needed for wheezing or shortness of breath.  Dispense: 8 g; Refill: 5 - fluticasone (FLONASE) 50 MCG/ACT nasal spray; Place 2 sprays into both nostrils daily.  Dispense: 16 g; Refill: 6  3. Moderate single current episode of major depressive disorder ([place]) resolved  4. Encounter for screening mammogram for breast cancer Schedule after age 34 at [facility]; Future   Partially dictated using Editor, commissioning. Any errors are unintentional.  Halina Maidens, MD [facility]  06/13/2020

## 2020-06-22 DIAGNOSIS — M79672 Pain in left foot: Secondary | ICD-10-CM | POA: Diagnosis not present

## 2020-06-22 DIAGNOSIS — M79671 Pain in right foot: Secondary | ICD-10-CM | POA: Diagnosis not present

## 2020-06-22 DIAGNOSIS — M659 Synovitis and tenosynovitis, unspecified: Secondary | ICD-10-CM | POA: Diagnosis not present

## 2020-06-22 DIAGNOSIS — M2012 Hallux valgus (acquired), left foot: Secondary | ICD-10-CM | POA: Diagnosis not present

## 2020-07-25 ENCOUNTER — Other Ambulatory Visit: Payer: Self-pay

## 2020-07-25 ENCOUNTER — Encounter: Payer: Self-pay | Admitting: Internal Medicine

## 2020-07-25 MED ORDER — IBUPROFEN 600 MG PO TABS: 600.0000 mg | ORAL_TABLET | Freq: Three times a day (TID) | ORAL | 3 refills | Status: DC | PRN

## 2020-10-19 ENCOUNTER — Encounter: Payer: Self-pay | Admitting: Internal Medicine

## 2020-10-19 ENCOUNTER — Other Ambulatory Visit
Admission: RE | Admit: 2020-10-19 | Discharge: 2020-10-19 | Disposition: A | Discharge status: Home / Self Care | Payer: 59 | Attending: Internal Medicine | Admitting: Internal Medicine

## 2020-10-19 ENCOUNTER — Other Ambulatory Visit: Payer: Self-pay

## 2020-10-19 ENCOUNTER — Ambulatory Visit (INDEPENDENT_AMBULATORY_CARE_PROVIDER_SITE_OTHER): Payer: 59 | Admitting: Internal Medicine

## 2020-10-19 VITALS — BP 124/72 | HR 104 | Temp 98.3°F | Ht 61.0 in | Wt 196.0 lb

## 2020-10-19 DIAGNOSIS — M791 Myalgia, unspecified site: Secondary | ICD-10-CM

## 2020-10-19 DIAGNOSIS — I10 Essential (primary) hypertension: Secondary | ICD-10-CM

## 2020-10-19 DIAGNOSIS — F331 Major depressive disorder, recurrent, moderate: Secondary | ICD-10-CM | POA: Diagnosis not present

## 2020-10-19 LAB — BASIC METABOLIC PANEL
Anion gap: 7 (ref 5–15)
BUN: 13 mg/dL (ref 6–20)
CO2: 25 mmol/L (ref 22–32)
Calcium: 8.9 mg/dL (ref 8.9–10.3)
Chloride: 103 mmol/L (ref 98–111)
Creatinine, Ser: 0.52 mg/dL (ref 0.44–1.00)
GFR, Estimated: >60 mL/min (ref >60)
Glucose, Bld: 89 mg/dL (ref 70–99)
Potassium: 3.3 mmol/L — ABNORMAL LOW (ref 3.5–5.1)
Sodium: 135 mmol/L (ref 135–145)

## 2020-10-19 LAB — MAGNESIUM: Magnesium: 2.3 mg/dL (ref 1.7–2.4)

## 2020-10-19 MED ORDER — DULOXETINE HCL 30 MG PO CPEP: 30.0000 mg | ORAL_CAPSULE | Freq: Every day | ORAL | 1 refills | Status: DC

## 2020-10-19 NOTE — Progress Notes (Signed)
Date:  10/19/2020   Name:  Theresa Bowen   DOB:  18-Jun-1980   MRN:  [id number]   Chief Complaint: Back Pain (X1 year, constant sore pain, whole back, pain shoots down leg to the back of her right calf, lump on right side of back, not painful to touch) and Leg Pain (X2 weeks, right leg, pain comes and goes )  Back Pain This is a chronic problem. The current episode started more than 1 year ago. The problem occurs daily. The problem is unchanged. The pain is present in the lumbar spine, sacro-iliac and thoracic spine. The quality of the pain is described as aching. The pain does not radiate. The pain is moderate. Associated symptoms include leg pain. Pertinent negatives include no bladder incontinence, bowel incontinence, chest pain, fever or tingling. She has tried NSAIDs for the symptoms. The treatment provided no relief.  Leg Pain  The incident occurred more than 1 week ago. There was no injury mechanism. The pain is present in the right leg. The quality of the pain is described as aching. The pain is mild. Pertinent negatives include no tingling. The symptoms are aggravated by movement. She has tried NSAIDs for the symptoms. The treatment provided no relief.  Hypertension This is a chronic problem. The problem is controlled. Pertinent negatives include no chest pain or shortness of breath. Past treatments include ACE inhibitors and diuretics. The current treatment provides significant improvement.   Lab Results  Component Value Date   CREATININE 0.50 06/13/2020   BUN 9 06/13/2020   NA 135 06/13/2020   K 3.1 (L) 06/13/2020   CL 101 06/13/2020   CO2 26 06/13/2020   Lab Results  Component Value Date   CHOL 207 (H) 04/27/2019   HDL 42 04/27/2019   LDLCALC 139 (H) 04/27/2019   TRIG 129 04/27/2019   CHOLHDL 4.9 04/27/2019   Lab Results  Component Value Date   TSH 1.190 06/13/2020   Lab Results  Component Value Date   HGBA1C 5.5 05/19/2017   Lab Results  Component Value Date    WBC 4.9 06/13/2020   HGB 8.6 (L) 06/13/2020   HCT 29.4 (L) 06/13/2020   MCV 64.8 (L) 06/13/2020   PLT 353 06/13/2020   Lab Results  Component Value Date   ALT 15 06/13/2020   AST 19 06/13/2020   ALKPHOS 63 06/13/2020   BILITOT 0.5 06/13/2020     Review of Systems  Constitutional:  Negative for chills, fatigue, fever and unexpected weight change.  Respiratory:  Negative for chest tightness and shortness of breath.   Cardiovascular:  Negative for chest pain and leg swelling.  Gastrointestinal:  Negative for bowel incontinence.  Genitourinary:  Negative for bladder incontinence.  Musculoskeletal:  Positive for back pain and myalgias (diffuse pain in arms, legs, back and chest).  Neurological:  Negative for tingling.  Psychiatric/Behavioral:  Positive for dysphoric mood and sleep disturbance. The patient is nervous/anxious.    Patient Active Problem List   Diagnosis Date Noted   Muscle spasms of neck 09/09/2019   Carpal tunnel syndrome on both sides 09/09/2019   Moderate single current episode of major depressive disorder (Many) 04/07/2019   Laryngopharyngeal reflux 12/28/2018   Allergic rhinitis 12/28/2018   Muscle tension dysphonia 12/28/2018   Musculoskeletal neck pain 12/28/2018   Referred otalgia of left ear 12/28/2018   Hyperlipidemia, mild 04/29/2017   Anxiety 07/22/2014   Abnormal finding on thyroid function test 07/22/2014   Essential (primary) hypertension 07/22/2014  Anemia, iron deficiency 07/22/2014   Local edema 07/22/2014   LBP (low back pain) 07/22/2014    Allergies  Allergen Reactions   Augmentin [Amoxicillin-Pot Clavulanate] Diarrhea    Past Surgical History:  Procedure Laterality Date   c-section  2004   TONSILLECTOMY     TUBAL LIGATION  2016    Social History   Tobacco Use   Smoking status: Every Day    Packs/day: 0.50    Years: 15.00    Pack years: 7.50    Types: Cigarettes    Start date: 2007   Smokeless tobacco: Never  Vaping Use    Vaping Use: Never used  Substance Use Topics   Alcohol use: No    Alcohol/week: 0.0 standard drinks   Drug use: No     Medication list has been reviewed and updated.  Current Meds  Medication Sig   albuterol (VENTOLIN HFA) 108 (90 Base) MCG/ACT inhaler Inhale 2 puffs into the lungs every 6 (six) hours as needed for wheezing or shortness of breath.   fluticasone (FLONASE) 50 MCG/ACT nasal spray Place 2 sprays into both nostrils daily.   ibuprofen (ADVIL) 600 MG tablet Take 1 tablet (600 mg total) by mouth every 8 (eight) hours as needed.   lisinopril-hydrochlorothiazide (ZESTORETIC) 20-12.5 MG tablet Take 1 tablet by mouth daily.   Multiple Vitamins-Minerals (MULTIVITAMIN WITH MINERALS) tablet Take 1 tablet by mouth.   potassium chloride (KLOR-CON) 10 MEQ tablet Take 1 tablet (10 mEq total) by mouth daily.   [DISCONTINUED] cyclobenzaprine (FLEXERIL) 5 MG tablet Take 1-2 tablets 3 times daily as needed   [DISCONTINUED] naproxen (NAPROSYN) 500 MG tablet Take 1 tablet (500 mg total) by mouth 2 (two) times daily with a meal.    PHQ 2/9 Scores 10/19/2020 06/13/2020 09/09/2019 04/27/2019  PHQ - 2 Score 1 0 0 2  PHQ- 9 Score 14 7 0 10    GAD 7 : Generalized Anxiety Score 10/19/2020 06/13/2020 09/09/2019 04/27/2019  Nervous, Anxious, on Edge 1 1 0 2  Control/stop worrying 2 1 0 3  Worry too much - different things 3 1 0 3  Trouble relaxing 2 0 0 3  Restless 1 0 0 2  Easily annoyed or irritable 1 0 0 3  Afraid - awful might happen 1 0 0 1  Total GAD 7 Score 11 3 0 17  Anxiety Difficulty - Not difficult at all Not difficult at all Somewhat difficult    BP Readings from Last 3 Encounters:  10/19/20 124/72  06/13/20 130/72  02/26/20 132/86    Physical Exam Vitals and nursing note reviewed.  Constitutional:      General: She is not in acute distress.    Appearance: Normal appearance. She is well-developed.  HENT:     Head: Normocephalic and atraumatic.  Cardiovascular:     Rate and  Rhythm: Normal rate and regular rhythm.  Pulmonary:     Effort: Pulmonary effort is normal. No respiratory distress.     Breath sounds: No wheezing or rhonchi.  Musculoskeletal:     Cervical back: Normal range of motion.     Comments: Multiple tender points across chest, upper arms, distal thighs, sacro-iliac region suggestive of fibromyalgia syndrome.  Lymphadenopathy:     Cervical: No cervical adenopathy.  Skin:    General: Skin is warm and dry.     Findings: No rash.  Neurological:     Mental Status: She is alert and oriented to person, place, and time.  Psychiatric:  Mood and Affect: Mood normal.        Behavior: Behavior normal.    Wt Readings from Last 3 Encounters:  10/19/20 196 lb (88.9 kg)  06/13/20 200 lb (90.7 kg)  02/26/20 193 lb (87.5 kg)    BP 124/72   Pulse (!) 104   Temp 98.3 F (36.8 C) (Oral)   Ht '5\' 1"'$  (1.549 m)   Wt 196 lb (88.9 kg)   LMP 10/10/2020   SpO2 100%   BMI 37.03 kg/m   Assessment and Plan: 1. Myalgia Diffuse discomfort without objective findings suggestive of fibromyalgia. Rule out metabolic abnormality. - Basic metabolic panel - Magnesium  2. Moderate recurrent major depression (HCC) Moderate depressive sx - partly due to physical symptoms and the associated limitations. Begin Cymbalta - may also help with diffuse soft tissues pain - DULoxetine (CYMBALTA) 30 MG capsule; Take 1 capsule (30 mg total) by mouth daily.  Dispense: 30 capsule; Refill: 1  3. Essential (primary) hypertension Clinically stable exam with well controlled BP. Tolerating medications without side effects at this time. Pt to continue current regimen and low sodium diet; benefits of regular exercise as able discussed.   Partially dictated using Editor, commissioning. Any errors are unintentional.  Halina Maidens, MD [facility]  10/19/2020

## 2020-11-09 ENCOUNTER — Encounter: Payer: 59 | Admitting: Internal Medicine

## 2020-11-28 ENCOUNTER — Other Ambulatory Visit: Payer: Self-pay | Admitting: Internal Medicine

## 2020-11-28 ENCOUNTER — Encounter: Payer: Self-pay | Admitting: Internal Medicine

## 2020-11-28 DIAGNOSIS — E876 Hypokalemia: Secondary | ICD-10-CM

## 2020-11-28 MED ORDER — POTASSIUM CHLORIDE ER 10 MEQ PO TBCR: 20.0000 meq | EXTENDED_RELEASE_TABLET | Freq: Every day | ORAL | 0 refills | Status: DC

## 2020-12-06 ENCOUNTER — Other Ambulatory Visit: Payer: Self-pay

## 2020-12-06 ENCOUNTER — Ambulatory Visit
Admission: RE | Admit: 2020-12-06 | Discharge: 2020-12-06 | Disposition: A | Discharge status: Home / Self Care | Payer: 59 | Source: Ambulatory Visit | Attending: Internal Medicine | Admitting: Internal Medicine

## 2020-12-06 DIAGNOSIS — Z1231 Encounter for screening mammogram for malignant neoplasm of breast: Secondary | ICD-10-CM | POA: Insufficient documentation

## 2020-12-07 ENCOUNTER — Ambulatory Visit: Payer: 59 | Admitting: Internal Medicine

## 2020-12-13 ENCOUNTER — Other Ambulatory Visit: Payer: Self-pay | Admitting: Internal Medicine

## 2020-12-13 DIAGNOSIS — N6320 Unspecified lump in the left breast, unspecified quadrant: Secondary | ICD-10-CM

## 2020-12-13 DIAGNOSIS — N6489 Other specified disorders of breast: Secondary | ICD-10-CM

## 2020-12-13 DIAGNOSIS — R928 Other abnormal and inconclusive findings on diagnostic imaging of breast: Secondary | ICD-10-CM

## 2020-12-19 ENCOUNTER — Telehealth: Payer: 59 | Admitting: Emergency Medicine

## 2020-12-19 DIAGNOSIS — J019 Acute sinusitis, unspecified: Secondary | ICD-10-CM | POA: Diagnosis not present

## 2020-12-19 MED ORDER — DOXYCYCLINE HYCLATE 100 MG PO TABS: 100.0000 mg | ORAL_TABLET | Freq: Two times a day (BID) | ORAL | 0 refills | Status: DC

## 2020-12-19 NOTE — Progress Notes (Signed)
E-Visit for Sinus Problems  We are sorry that you are not feeling well.  Here is how we plan to help!  Based on what you have shared with me it looks like you have sinusitis.  Sinusitis is inflammation and infection in the sinus cavities of the head.  Based on your presentation I believe you most likely have Acute Bacterial Sinusitis.  This is an infection caused by bacteria and is treated with antibiotics. I have prescribed Doxycycline 100mg  by mouth twice a day for 10 days. Azithromycin or "Z-pak" doesn't typically work as well for sinus infections anymore.   You may use an oral decongestant such as plain Mucinex. Saline nasal spray help and can safely be used as often as needed for congestion.  If you develop worsening sinus pain, fever or notice severe headache and vision changes, or if symptoms are not better after completion of antibiotic, please schedule an appointment with a health care provider.    Sinus infections are not as easily transmitted as other respiratory infection, however we still recommend that you avoid close contact with loved ones, especially the very young and elderly.  Remember to wash your hands thoroughly throughout the day as this is the number one way to prevent the spread of infection!  Home Care: Only take medications as instructed by your medical team. Complete the entire course of an antibiotic. Do not take these medications with alcohol. A steam or ultrasonic humidifier can help congestion.  You can place a towel over your head and breathe in the steam from hot water coming from a faucet. Avoid close contacts especially the very young and the elderly. Cover your mouth when you cough or sneeze. Always remember to wash your hands.  Get Help Right Away If: You develop worsening fever or sinus pain. You develop a severe head ache or visual changes. Your symptoms persist after you have completed your treatment plan.  Make sure you Understand these  instructions. Will watch your condition. Will get help right away if you are not doing well or get worse.  Thank you for choosing an e-visit.  Your e-visit answers were reviewed by a board certified advanced clinical practitioner to complete your personal care plan. Depending upon the condition, your plan could have included both over the counter or prescription medications.  Please review your pharmacy choice. Make sure the pharmacy is open so you can pick up prescription now. If there is a problem, you may contact your provider through [employer] and have the prescription routed to another pharmacy.  Your safety is important to [place]. If you have drug allergies check your prescription carefully.   For the next 24 hours you can use MyChart to ask questions about today's visit, request a non-urgent call back, or ask for a work or school excuse. You will get an email in the next two days asking about your experience. I hope that your e-visit has been valuable and will speed your recovery.  I have spent 5 minutes in review of e-visit questionnaire, review and updating patient chart, medical decision making and response to patient.   Willeen Cass, PhD, FNP-BC

## 2020-12-20 ENCOUNTER — Other Ambulatory Visit: Payer: Self-pay | Admitting: Internal Medicine

## 2020-12-20 DIAGNOSIS — I10 Essential (primary) hypertension: Secondary | ICD-10-CM

## 2020-12-21 NOTE — Telephone Encounter (Signed)
Requested Prescriptions  Pending Prescriptions Disp Refills  . lisinopril-hydrochlorothiazide (ZESTORETIC) 20-12.5 MG tablet [Pharmacy Med Name: LISINOPRIL-HCTZ 20/12.5MG  TABLETS] 30 tablet 5    Sig: TAKE 1 TABLET BY MOUTH DAILY     Cardiovascular:  ACEI + Diuretic Combos Failed - 12/20/2020  5:15 PM      Failed - K in normal range and within 180 days    Potassium  Date Value Ref Range Status  10/19/2020 3.3 (L) 3.5 - 5.1 mmol/L Final  06/06/2014 3.3 (L) mmol/L Final    Comment:    3.5-5.1 NOTE: New Reference Range  05/03/14          Passed - Na in normal range and within 180 days    Sodium  Date Value Ref Range Status  10/19/2020 135 135 - 145 mmol/L Final  06/06/2014 138 mmol/L Final    Comment:    135-145 NOTE: New Reference Range  05/03/14          Passed - Cr in normal range and within 180 days    Creatinine  Date Value Ref Range Status  06/06/2014 0.48 mg/dL Final    Comment:    0.44-1.00 NOTE: New Reference Range  05/03/14    Creatinine, Ser  Date Value Ref Range Status  10/19/2020 0.52 0.44 - 1.00 mg/dL Final         Passed - Ca in normal range and within 180 days    Calcium  Date Value Ref Range Status  10/19/2020 8.9 8.9 - 10.3 mg/dL Final   Calcium, Total  Date Value Ref Range Status  06/06/2014 8.8 (L) mg/dL Final    Comment:    8.9-10.3 NOTE: New Reference Range  05/03/14          Passed - Patient is not pregnant      Passed - Last BP in normal range    BP Readings from Last 1 Encounters:  10/19/20 124/72         Passed - Valid encounter within last 6 months    Recent Outpatient Visits          2 months ago [facility] Glean Hess, MD   6 months ago Essential (primary) hypertension   [facility] Glean Hess, MD   1 year ago Muscle spasms of neck   [facility] Glean Hess, MD   1 year ago Annual physical exam   [facility] Glean Hess, MD   1 year ago  Acute non-recurrent pansinusitis   [facility] Glean Hess, MD      Future Appointments            In 2 months Glean Hess, MD [facility], [facility]

## 2020-12-25 ENCOUNTER — Ambulatory Visit
Admission: RE | Admit: 2020-12-25 | Discharge: 2020-12-25 | Disposition: A | Discharge status: Home / Self Care | Payer: 59 | Source: Ambulatory Visit | Attending: Internal Medicine | Admitting: Internal Medicine

## 2020-12-25 ENCOUNTER — Other Ambulatory Visit: Payer: Self-pay

## 2020-12-25 DIAGNOSIS — R928 Other abnormal and inconclusive findings on diagnostic imaging of breast: Secondary | ICD-10-CM | POA: Diagnosis not present

## 2020-12-25 DIAGNOSIS — N6489 Other specified disorders of breast: Secondary | ICD-10-CM

## 2020-12-25 DIAGNOSIS — N6320 Unspecified lump in the left breast, unspecified quadrant: Secondary | ICD-10-CM | POA: Diagnosis not present

## 2020-12-25 DIAGNOSIS — R922 Inconclusive mammogram: Secondary | ICD-10-CM | POA: Diagnosis not present

## 2021-01-02 ENCOUNTER — Other Ambulatory Visit: Payer: Self-pay | Admitting: Internal Medicine

## 2021-01-02 DIAGNOSIS — E876 Hypokalemia: Secondary | ICD-10-CM

## 2021-01-02 NOTE — Telephone Encounter (Signed)
Requested Prescriptions  Pending Prescriptions Disp Refills  . potassium chloride (KLOR-CON) 10 MEQ tablet [Pharmacy Med Name: POTASSIUM CL 10MEQ ER TABLETS] 60 tablet 0    Sig: TAKE 2 TABLETS(20 MEQ) BY MOUTH DAILY     Endocrinology:  Minerals - Potassium Supplementation Failed - 01/02/2021 11:47 AM      Failed - K in normal range and within 360 days    Potassium  Date Value Ref Range Status  10/19/2020 3.3 (L) 3.5 - 5.1 mmol/L Final  06/06/2014 3.3 (L) mmol/L Final    Comment:    3.5-5.1 NOTE: New Reference Range  05/03/14          Passed - Cr in normal range and within 360 days    Creatinine  Date Value Ref Range Status  06/06/2014 0.48 mg/dL Final    Comment:    0.44-1.00 NOTE: New Reference Range  05/03/14    Creatinine, Ser  Date Value Ref Range Status  10/19/2020 0.52 0.44 - 1.00 mg/dL Final         Passed - Valid encounter within last 12 months    Recent Outpatient Visits          2 months ago [place], MD   6 months ago Essential (primary) hypertension   [facility] Glean Hess, MD   1 year ago Muscle spasms of neck   [facility] Glean Hess, MD   1 year ago Annual physical exam   [facility] Glean Hess, MD   1 year ago Acute non-recurrent pansinusitis   [facility] Glean Hess, MD      Future Appointments            In 2 months Army Melia Jesse Sans, MD [facility], [facility]

## 2021-01-08 ENCOUNTER — Other Ambulatory Visit: Payer: Self-pay | Admitting: Internal Medicine

## 2021-01-08 DIAGNOSIS — E876 Hypokalemia: Secondary | ICD-10-CM

## 2021-01-09 NOTE — Telephone Encounter (Signed)
Requested medication (s) are due for refill today:   Yes  It's running out early because she takes 2 a day  Requested medication (s) are on the active medication list:   Yes  Future visit scheduled:   Yes in Feb. 2023   Last ordered: 01/02/2021 #60, 0 refills  Returned because wasn't sure what Dr. Army Melia wanted to do in regards to refills.   She has an upcoming appt in Feb.    Requested Prescriptions  Pending Prescriptions Disp Refills   potassium chloride (KLOR-CON) 10 MEQ tablet [Pharmacy Med Name: POTASSIUM CL 10MEQ ER TABLETS] 30 tablet     Sig: TAKE 1 TABLET(10 MEQ) BY MOUTH DAILY     Endocrinology:  Minerals - Potassium Supplementation Failed - 01/08/2021  1:29 PM      Failed - K in normal range and within 360 days    Potassium  Date Value Ref Range Status  10/19/2020 3.3 (L) 3.5 - 5.1 mmol/L Final  06/06/2014 3.3 (L) mmol/L Final    Comment:    3.5-5.1 NOTE: New Reference Range  05/03/14           Passed - Cr in normal range and within 360 days    Creatinine  Date Value Ref Range Status  06/06/2014 0.48 mg/dL Final    Comment:    0.44-1.00 NOTE: New Reference Range  05/03/14    Creatinine, Ser  Date Value Ref Range Status  10/19/2020 0.52 0.44 - 1.00 mg/dL Final          Passed - Valid encounter within last 12 months    Recent Outpatient Visits           2 months ago [place], MD   7 months ago Essential (primary) hypertension   [facility] Glean Hess, MD   1 year ago Muscle spasms of neck   [facility] Glean Hess, MD   1 year ago Annual physical exam   [facility] Glean Hess, MD   1 year ago Acute non-recurrent pansinusitis   [facility] Glean Hess, MD       Future Appointments             In 2 months Army Melia Jesse Sans, MD [facility], [facility]

## 2021-02-06 ENCOUNTER — Ambulatory Visit
Admission: EM | Admit: 2021-02-06 | Discharge: 2021-02-06 | Disposition: A | Discharge status: Home / Self Care | Payer: 59 | Attending: Emergency Medicine | Admitting: Emergency Medicine

## 2021-02-06 ENCOUNTER — Other Ambulatory Visit: Payer: Self-pay

## 2021-02-06 DIAGNOSIS — J069 Acute upper respiratory infection, unspecified: Secondary | ICD-10-CM | POA: Diagnosis not present

## 2021-02-06 MED ORDER — BENZONATATE 100 MG PO CAPS: 200.0000 mg | ORAL_CAPSULE | Freq: Three times a day (TID) | ORAL | 0 refills | Status: DC

## 2021-02-06 MED ORDER — IPRATROPIUM BROMIDE 0.06 % NA SOLN: 2.0000 | Freq: Four times a day (QID) | NASAL | 12 refills | Status: DC

## 2021-02-06 MED ORDER — PROMETHAZINE-DM 6.25-15 MG/5ML PO SYRP: 5.0000 mL | ORAL_SOLUTION | Freq: Four times a day (QID) | ORAL | 0 refills | Status: DC | PRN

## 2021-02-06 NOTE — Discharge Instructions (Signed)
Use the Atrovent nasal spray, 2 squirts in each nostril every 6 hours, as needed for runny nose and postnasal drip.  Use the Tessalon Perles every 8 hours during the day.  Take them with a small sip of water.  They may give you some numbness to the base of your tongue or a metallic taste in your mouth, this is normal.  Use the Promethazine DM cough syrup at bedtime for cough and congestion.  It will make you drowsy so do not take it during the day.  Use OTC Tylenol or Ibuprofen as needed for fever or pain.  Return for reevaluation or see your primary care provider for any new or worsening symptoms.

## 2021-02-06 NOTE — ED Triage Notes (Signed)
Pt c/o body aches, cough, congestion, facial pain x2-3days

## 2021-02-06 NOTE — ED Provider Notes (Signed)
[facility]    CSN: [id number] Arrival date & time: 02/06/21  1622      History   Chief Complaint Chief Complaint  Patient presents with   Cough    HPI Theresa Bowen is a 40 y.o. female.   HPI  40 year old female here for evaluation of respiratory complaints.  Patient reports that for last 2 to 3 days she has been experiencing significant nasal congestion and facial pressure, sore throat, nonproductive cough, mild shortness of breath, and body aches.  She denies any fever, nasal discharge, or GI complaints.  Past Medical History:  Diagnosis Date   Hypertension     Patient Active Problem List   Diagnosis Date Noted   Moderate recurrent major depression ([place]) 10/19/2020   Myalgia 10/19/2020   Muscle spasms of neck 09/09/2019   Carpal tunnel syndrome on both sides 09/09/2019   Moderate single current episode of major depressive disorder ([place]) 04/07/2019   Laryngopharyngeal reflux 12/28/2018   Allergic rhinitis 12/28/2018   Muscle tension dysphonia 12/28/2018   Musculoskeletal neck pain 12/28/2018   Referred otalgia of left ear 12/28/2018   Hyperlipidemia, mild 04/29/2017   Anxiety 07/22/2014   Abnormal finding on thyroid function test 07/22/2014   Essential (primary) hypertension 07/22/2014   Anemia, iron deficiency 07/22/2014   Local edema 07/22/2014   LBP (low back pain) 07/22/2014    Past Surgical History:  Procedure Laterality Date   c-section  2004   TONSILLECTOMY     TUBAL LIGATION  2016    OB History     Gravida  4   Para  1   Term  1   Preterm      AB  1   Living  1      SAB      IAB      Ectopic  1   Multiple      Live Births               Home Medications    Prior to Admission medications   Medication Sig Start Date End Date Taking? Authorizing Provider  albuterol (VENTOLIN HFA) 108 (90 Base) MCG/ACT inhaler Inhale 2 puffs into the lungs every 6 (six) hours as needed for wheezing or shortness of  breath. 06/13/20  Yes Glean Hess, MD  benzonatate (TESSALON) 100 MG capsule Take 2 capsules (200 mg total) by mouth every 8 (eight) hours. 02/06/21  Yes Margarette Canada, NP  fluticasone (FLONASE) 50 MCG/ACT nasal spray Place 2 sprays into both nostrils daily. 06/13/20  Yes Glean Hess, MD  ipratropium (ATROVENT) 0.06 % nasal spray Place 2 sprays into both nostrils 4 (four) times daily. 02/06/21  Yes Margarette Canada, NP  lisinopril-hydrochlorothiazide (ZESTORETIC) 20-12.5 MG tablet TAKE 1 TABLET BY MOUTH DAILY 12/21/20  Yes Glean Hess, MD  Multiple Vitamins-Minerals (MULTIVITAMIN WITH MINERALS) tablet Take 1 tablet by mouth.   Yes [provider]  potassium chloride (KLOR-CON) 10 MEQ tablet TAKE 1 TABLET(10 MEQ) BY MOUTH DAILY 01/09/21  Yes Glean Hess, MD  promethazine-dextromethorphan (PROMETHAZINE-DM) 6.25-15 MG/5ML syrup Take 5 mLs by mouth 4 (four) times daily as needed. 02/06/21  Yes Margarette Canada, NP    Family History Family History  Problem Relation Age of Onset   CAD Mother    Atrial fibrillation Mother    CAD Father    Sudden Cardiac Death Brother    Stroke Brother 35   Sudden Cardiac Death Brother 72   Breast cancer Neg Hx  Social History Social History   Tobacco Use   Smoking status: Every Day    Packs/day: 0.50    Years: 15.00    Pack years: 7.50    Types: Cigarettes    Start date: 2007   Smokeless tobacco: Never  Vaping Use   Vaping Use: Never used  Substance Use Topics   Alcohol use: No    Alcohol/week: 0.0 standard drinks   Drug use: No     Allergies   Augmentin [amoxicillin-pot clavulanate]   Review of Systems Review of Systems  Constitutional:  Negative for activity change, appetite change and fever.  HENT:  Positive for congestion, sinus pressure and sore throat. Negative for ear pain and rhinorrhea.   Respiratory:  Positive for cough and shortness of breath. Negative for wheezing.   Gastrointestinal:  Negative for  diarrhea, nausea and vomiting.  Musculoskeletal:  Positive for arthralgias and myalgias.  Skin:  Negative for rash.  Hematological: Negative.   Psychiatric/Behavioral: Negative.      Physical Exam Triage Vital Signs ED Triage Vitals  Enc Vitals Group     BP --      Pulse --      Resp --      Temp --      Temp src --      SpO2 --      Weight 02/06/21 1635 199 lb (90.3 kg)     Height 02/06/21 1635 5\' 1"  (1.549 m)     Head Circumference --      Peak Flow --      Pain Score 02/06/21 1634 8     Pain Loc --      Pain Edu? --      Excl. in [place]? --    No data found.  Updated Vital Signs BP (!) 115/93 (BP Location: Left Arm)    Pulse 96    Temp 98.3 F (36.8 C) (Oral)    Resp 18    Ht 5\' 1"  (1.549 m)    Wt 199 lb (90.3 kg)    LMP 01/27/2021    SpO2 99%    BMI 37.60 kg/m   Visual Acuity Right Eye Distance:   Left Eye Distance:   Bilateral Distance:    Right Eye Near:   Left Eye Near:    Bilateral Near:     Physical Exam Vitals and nursing note reviewed.  Constitutional:      General: She is not in acute distress.    Appearance: Normal appearance. She is normal weight. She is not ill-appearing.  HENT:     Head: Normocephalic and atraumatic.     Right Ear: Tympanic membrane, ear canal and external ear normal. There is no impacted cerumen.     Left Ear: Tympanic membrane, ear canal and external ear normal. There is no impacted cerumen.     Nose: Congestion and rhinorrhea present.     Mouth/Throat:     Mouth: Mucous membranes are moist.     Pharynx: Oropharynx is clear. Posterior oropharyngeal erythema present.  Cardiovascular:     Rate and Rhythm: Normal rate and regular rhythm.     Pulses: Normal pulses.     Heart sounds: Normal heart sounds. No murmur heard.   No gallop.  Pulmonary:     Effort: Pulmonary effort is normal.     Breath sounds: Normal breath sounds. No wheezing, rhonchi or rales.  Musculoskeletal:     Cervical back: Normal range of motion and neck  supple.  Lymphadenopathy:     Cervical: Cervical adenopathy present.  Skin:    General: Skin is warm and dry.     Capillary Refill: Capillary refill takes less than 2 seconds.     Findings: No erythema or rash.  Neurological:     General: No focal deficit present.     Mental Status: She is alert and oriented to person, place, and time.  Psychiatric:        Mood and Affect: Mood normal.        Behavior: Behavior normal.        Thought Content: Thought content normal.        Judgment: Judgment normal.     UC Treatments / Results  Labs (all labs ordered are listed, but only abnormal results are displayed) Labs Reviewed - No data to display  EKG   Radiology No results found.  Procedures Procedures (including critical care time)  Medications Ordered in UC Medications - No data to display  Initial Impression / Assessment and Plan / UC Course  I have reviewed the triage vital signs and the nursing notes.  Pertinent labs & imaging results that were available during my care of the patient were reviewed by me and considered in my medical decision making (see chart for details).  Patient is a nontoxic-appearing 40 year old female here for evaluation of respiratory complaints as outlined HPI above.  Her physical exam reveals pearly gray tympanic membranes bilaterally with normal light reflex and clear external auditory canals.  Nasal mucosa is markedly edematous with mild erythema and scant clear discharge in both nares.  Posterior oropharynx does demonstrate erythema with clear postnasal drip.  Bilateral anterior cervical of adenopathy is present on exam.  Cardiopulmonary exam reveals clear lung sounds in all fields.  Patient exam is consistent with a viral URI with a cough and will treat accordingly with Tessalon Perles, Atrovent nasal spray, and Promethazine DM cough syrup.  Patient states she does not need a work note.   Final Clinical Impressions(s) / UC Diagnoses   Final  diagnoses:  Viral URI with cough     Discharge Instructions      Use the Atrovent nasal spray, 2 squirts in each nostril every 6 hours, as needed for runny nose and postnasal drip.  Use the Tessalon Perles every 8 hours during the day.  Take them with a small sip of water.  They may give you some numbness to the base of your tongue or a metallic taste in your mouth, this is normal.  Use the Promethazine DM cough syrup at bedtime for cough and congestion.  It will make you drowsy so do not take it during the day.  Use OTC Tylenol or Ibuprofen as needed for fever or pain.  Return for reevaluation or see your primary care provider for any new or worsening symptoms.      ED Prescriptions     Medication Sig Dispense Auth. Provider   benzonatate (TESSALON) 100 MG capsule Take 2 capsules (200 mg total) by mouth every 8 (eight) hours. 21 capsule Margarette Canada, NP   ipratropium (ATROVENT) 0.06 % nasal spray Place 2 sprays into both nostrils 4 (four) times daily. 15 mL Margarette Canada, NP   promethazine-dextromethorphan (PROMETHAZINE-DM) 6.25-15 MG/5ML syrup Take 5 mLs by mouth 4 (four) times daily as needed. 118 mL Margarette Canada, NP      PDMP not reviewed this encounter.   Margarette Canada, NP 02/06/21 1704

## 2021-02-13 ENCOUNTER — Other Ambulatory Visit: Payer: Self-pay | Admitting: Internal Medicine

## 2021-02-13 NOTE — Telephone Encounter (Signed)
Requested medications are due for refill today.  unsure  Requested medications are on the active medications list.  no  Last refill. 07/25/2020  Future visit scheduled.  yes   Notes to clinic.  Medication was d/c'd on 02/06/2021 at ED visit.    Requested Prescriptions  Pending Prescriptions Disp Refills   ibuprofen (ADVIL) 600 MG tablet [Pharmacy Med Name: IBUPROFEN 600MG  TABLETS] 30 tablet 3    Sig: TAKE 1 TABLET(600 MG) BY MOUTH EVERY 8 HOURS AS NEEDED     Analgesics:  NSAIDS Failed - 02/13/2021  8:24 AM      Failed - HGB in normal range and within 360 days    Hemoglobin  Date Value Ref Range Status  06/13/2020 8.6 (L) 12.0 - 15.0 g/dL Final   HGB  Date Value Ref Range Status  06/04/2014 8.2 (L) 12.0 - 16.0 g/dL Final          Passed - Cr in normal range and within 360 days    Creatinine  Date Value Ref Range Status  06/06/2014 0.48 mg/dL Final    Comment:    0.44-1.00 NOTE: New Reference Range  05/03/14    Creatinine, Ser  Date Value Ref Range Status  10/19/2020 0.52 0.44 - 1.00 mg/dL Final          Passed - Patient is not pregnant      Passed - Valid encounter within last 12 months    Recent Outpatient Visits           3 months ago [place], MD   8 months ago Essential (primary) hypertension   [facility] Glean Hess, MD   1 year ago Muscle spasms of neck   [facility] Glean Hess, MD   1 year ago Annual physical exam   [facility] Glean Hess, MD   1 year ago Acute non-recurrent pansinusitis   [facility] Glean Hess, MD       Future Appointments             In 1 month Army Melia, Jesse Sans, MD [facility]

## 2021-03-04 ENCOUNTER — Other Ambulatory Visit: Payer: Self-pay | Admitting: Internal Medicine

## 2021-03-04 DIAGNOSIS — E876 Hypokalemia: Secondary | ICD-10-CM

## 2021-03-04 MED ORDER — POTASSIUM CHLORIDE ER 10 MEQ PO TBCR: 10.0000 meq | EXTENDED_RELEASE_TABLET | Freq: Once | ORAL | 0 refills | Status: DC

## 2021-03-06 ENCOUNTER — Encounter: Payer: Self-pay | Admitting: Internal Medicine

## 2021-03-07 ENCOUNTER — Other Ambulatory Visit: Payer: Self-pay

## 2021-03-07 DIAGNOSIS — E876 Hypokalemia: Secondary | ICD-10-CM

## 2021-03-07 MED ORDER — POTASSIUM CHLORIDE ER 10 MEQ PO TBCR: 10.0000 meq | EXTENDED_RELEASE_TABLET | Freq: Two times a day (BID) | ORAL | 0 refills | Status: DC

## 2021-03-15 ENCOUNTER — Encounter: Payer: Self-pay | Admitting: Internal Medicine

## 2021-03-15 ENCOUNTER — Telehealth: Payer: Self-pay | Admitting: Internal Medicine

## 2021-03-15 ENCOUNTER — Encounter: Payer: 59 | Admitting: Internal Medicine

## 2021-03-15 DIAGNOSIS — N6320 Unspecified lump in the left breast, unspecified quadrant: Secondary | ICD-10-CM | POA: Insufficient documentation

## 2021-03-15 NOTE — Progress Notes (Deleted)
Date:  03/15/2021   Name:  Theresa Bowen   DOB:  June 18, 1980   MRN:  [id number]   Chief Complaint: No chief complaint on file. Theresa Bowen is a 41 y.o. female who presents today for her Complete Annual Exam. She feels {DESC; WELL/FAIRLY WELL/POORLY:18703}. She reports exercising ***. She reports she is sleeping {DESC; WELL/FAIRLY WELL/POORLY:18703}. Breast complaints ***.  Mammogram: 11/2020 - right asymetry/left mass; repeat 6 mo left  DEXA: none Pap smear: 04/2019 negative with co-testing Colonoscopy: none  Immunization History  Administered Date(s) Administered   Influenza,inj,Quad PF,6+ Mos 11/17/2014, 04/28/2017, 12/24/2019   Influenza-Unspecified 01/15/2019   PFIZER(Purple Top)SARS-COV-2 Vaccination 05/23/2019, 06/15/2019   Tdap 03/29/2014    Hypertension This is a chronic problem. The problem is controlled. Past treatments include ACE inhibitors and diuretics. There is no history of kidney disease, CAD/MI or CVA.   Lab Results  Component Value Date   NA 135 10/19/2020   K 3.3 (L) 10/19/2020   CO2 25 10/19/2020   GLUCOSE 89 10/19/2020   BUN 13 10/19/2020   CREATININE 0.52 10/19/2020   CALCIUM 8.9 10/19/2020   GFRNONAA >60 10/19/2020   Lab Results  Component Value Date   CHOL 207 (H) 04/27/2019   HDL 42 04/27/2019   LDLCALC 139 (H) 04/27/2019   TRIG 129 04/27/2019   CHOLHDL 4.9 04/27/2019   Lab Results  Component Value Date   TSH 1.190 06/13/2020   Lab Results  Component Value Date   HGBA1C 5.5 05/19/2017   Lab Results  Component Value Date   WBC 4.9 06/13/2020   HGB 8.6 (L) 06/13/2020   HCT 29.4 (L) 06/13/2020   MCV 64.8 (L) 06/13/2020   PLT 353 06/13/2020   Lab Results  Component Value Date   ALT 15 06/13/2020   AST 19 06/13/2020   ALKPHOS 63 06/13/2020   BILITOT 0.5 06/13/2020   No results found for: Davy Pique, VD25OH   Review of Systems  Patient Active Problem List   Diagnosis Date Noted   Moderate recurrent major  depression ([place]) 10/19/2020   Myalgia 10/19/2020   Muscle spasms of neck 09/09/2019   Carpal tunnel syndrome on both sides 09/09/2019   Moderate single current episode of major depressive disorder ([place]) 04/07/2019   Laryngopharyngeal reflux 12/28/2018   Allergic rhinitis 12/28/2018   Muscle tension dysphonia 12/28/2018   Musculoskeletal neck pain 12/28/2018   Referred otalgia of left ear 12/28/2018   Hyperlipidemia, mild 04/29/2017   Anxiety 07/22/2014   Abnormal finding on thyroid function test 07/22/2014   Essential (primary) hypertension 07/22/2014   Anemia, iron deficiency 07/22/2014   Local edema 07/22/2014   LBP (low back pain) 07/22/2014    Allergies  Allergen Reactions   Augmentin [Amoxicillin-Pot Clavulanate] Diarrhea    Past Surgical History:  Procedure Laterality Date   c-section  2004   TONSILLECTOMY     TUBAL LIGATION  2016    Social History   Tobacco Use   Smoking status: Every Day    Packs/day: 0.50    Years: 15.00    Pack years: 7.50    Types: Cigarettes    Start date: 2007   Smokeless tobacco: Never  Vaping Use   Vaping Use: Never used  Substance Use Topics   Alcohol use: No    Alcohol/week: 0.0 standard drinks   Drug use: No     Medication list has been reviewed and updated.  No outpatient medications have been marked as taking for the 03/15/21 encounter (  Appointment) with Glean Hess, MD.    [facility] 2/9 Scores 10/19/2020 06/13/2020 09/09/2019 04/27/2019  PHQ - 2 Score 1 0 0 2  PHQ- 9 Score 14 7 0 10    GAD 7 : Generalized Anxiety Score 10/19/2020 06/13/2020 09/09/2019 04/27/2019  Nervous, Anxious, on Edge 1 1 0 2  Control/stop worrying 2 1 0 3  Worry too much - different things 3 1 0 3  Trouble relaxing 2 0 0 3  Restless 1 0 0 2  Easily annoyed or irritable 1 0 0 3  Afraid - awful might happen 1 0 0 1  Total GAD 7 Score 11 3 0 17  Anxiety Difficulty - Not difficult at all Not difficult at all Somewhat difficult    BP Readings from Last  3 Encounters:  02/06/21 (!) 115/93  10/19/20 124/72  06/13/20 130/72    Physical Exam  Wt Readings from Last 3 Encounters:  02/06/21 199 lb (90.3 kg)  10/19/20 196 lb (88.9 kg)  06/13/20 200 lb (90.7 kg)    There were no vitals taken for this visit.  Assessment and Plan:

## 2021-03-15 NOTE — Telephone Encounter (Signed)
Called patient to reschedule no show this morning, left voicemail.

## 2021-03-29 DIAGNOSIS — M545 Low back pain, unspecified: Secondary | ICD-10-CM | POA: Diagnosis not present

## 2021-03-29 DIAGNOSIS — I10 Essential (primary) hypertension: Secondary | ICD-10-CM | POA: Diagnosis not present

## 2021-03-29 DIAGNOSIS — F321 Major depressive disorder, single episode, moderate: Secondary | ICD-10-CM | POA: Diagnosis not present

## 2021-03-29 DIAGNOSIS — Z0000 Encounter for general adult medical examination without abnormal findings: Secondary | ICD-10-CM | POA: Diagnosis not present

## 2021-03-29 DIAGNOSIS — D500 Iron deficiency anemia secondary to blood loss (chronic): Secondary | ICD-10-CM | POA: Diagnosis not present

## 2021-03-29 DIAGNOSIS — M791 Myalgia, unspecified site: Secondary | ICD-10-CM | POA: Diagnosis not present

## 2021-04-06 ENCOUNTER — Encounter: Payer: Self-pay | Admitting: *Deleted

## 2021-04-11 DIAGNOSIS — E538 Deficiency of other specified B group vitamins: Secondary | ICD-10-CM | POA: Diagnosis not present

## 2021-04-12 ENCOUNTER — Inpatient Hospital Stay: Payer: 59

## 2021-04-12 ENCOUNTER — Inpatient Hospital Stay: Payer: 59 | Attending: Oncology | Admitting: Oncology

## 2021-04-12 ENCOUNTER — Encounter: Payer: Self-pay | Admitting: Oncology

## 2021-04-12 ENCOUNTER — Other Ambulatory Visit: Payer: Self-pay

## 2021-04-12 VITALS — BP 121/86 | HR 109 | Temp 98.9°F | Wt 195.0 lb

## 2021-04-12 DIAGNOSIS — E538 Deficiency of other specified B group vitamins: Secondary | ICD-10-CM | POA: Diagnosis not present

## 2021-04-12 DIAGNOSIS — D500 Iron deficiency anemia secondary to blood loss (chronic): Secondary | ICD-10-CM | POA: Diagnosis not present

## 2021-04-12 DIAGNOSIS — N920 Excessive and frequent menstruation with regular cycle: Secondary | ICD-10-CM | POA: Diagnosis not present

## 2021-04-12 DIAGNOSIS — F17210 Nicotine dependence, cigarettes, uncomplicated: Secondary | ICD-10-CM | POA: Diagnosis not present

## 2021-04-12 DIAGNOSIS — Z803 Family history of malignant neoplasm of breast: Secondary | ICD-10-CM | POA: Insufficient documentation

## 2021-04-12 MED ORDER — DOCUSATE SODIUM 100 MG PO CAPS: 100.0000 mg | ORAL_CAPSULE | Freq: Two times a day (BID) | ORAL | 1 refills | Status: DC

## 2021-04-12 MED ORDER — FERROUS SULFATE 325 (65 FE) MG PO TBEC: 325.0000 mg | DELAYED_RELEASE_TABLET | Freq: Two times a day (BID) | ORAL | 2 refills | Status: DC

## 2021-04-12 NOTE — Progress Notes (Signed)
Hematology/Oncology Consult note [phone] [phone]         Patient Care Team: Gladstone Lighter, MD as PCP - General (Internal Medicine) [facility] (Obstetrics and Gynecology) Earlie Server, MD as Consulting Physician (Hematology)  REFERRING PROVIDER: Gladstone Lighter, MD  CHIEF COMPLAINTS/REASON FOR VISIT:  Evaluation of iron deficiency anemia  HISTORY OF PRESENTING ILLNESS:   Theresa Bowen is a  41 y.o.  female with PMH listed below was seen in consultation at the request of  Gladstone Lighter, MD  for evaluation of iron deficiency anemia.   03/29/2021 cbc showed hemoglobin of 7, mcv 61, normal wbc, platelet 411,000, ferritin 4, TIBC 622, saturation 4% B12 228,  She is very tired, lightheaded. She has started IM B12 injection with her PCP.  + heavy menstrual period.  No melena, blood in the stool.     Review of Systems  Constitutional:  Positive for fatigue. Negative for appetite change, chills and fever.  HENT:   Negative for hearing loss and voice change.   Eyes:  Negative for eye problems.  Respiratory:  Negative for chest tightness and cough.   Cardiovascular:  Negative for chest pain.  Gastrointestinal:  Negative for abdominal distention, abdominal pain and blood in stool.  Endocrine: Negative for hot flashes.  Genitourinary:  Positive for menstrual problem. Negative for difficulty urinating and frequency.   Musculoskeletal:  Negative for arthralgias.  Skin:  Negative for itching and rash.  Neurological:  Negative for extremity weakness.  Hematological:  Negative for adenopathy.  Psychiatric/Behavioral:  Negative for confusion.    MEDICAL HISTORY:  Past Medical History:  Diagnosis Date   Abnormal thyroid function test 07/22/2014   Fibromyalgia    Hyperlipidemia 04/29/2017   Hypertension    Hypokalemia    IDA (iron deficiency anemia) 07/22/2014   Referred otalgia of left ear 12/28/2018    SURGICAL HISTORY: Past Surgical  History:  Procedure Laterality Date   c-section  2004   TONSILLECTOMY     TUBAL LIGATION  2016    SOCIAL HISTORY: Social History   Socioeconomic History   Marital status: Divorced    Spouse name: Not on file   Number of children: 2   Years of education: Not on file   Highest education level: Not on file  Occupational History   Not on file  Tobacco Use   Smoking status: Every Day    Packs/day: 0.50    Years: 15.00    Pack years: 7.50    Types: Cigarettes    Start date: 2007   Smokeless tobacco: Never  Vaping Use   Vaping Use: Never used  Substance and Sexual Activity   Alcohol use: No    Alcohol/week: 0.0 standard drinks   Drug use: No   Sexual activity: Yes    Birth control/protection: Surgical  Other Topics Concern   Not on file  Social History Narrative   Not on file   Social Determinants of Health   Financial Resource Strain: Not on file  Food Insecurity: Not on file  Transportation Needs: Not on file  Physical Activity: Not on file  Stress: Not on file  Social Connections: Not on file  Intimate Partner Violence: Not on file    FAMILY HISTORY: Family History  Problem Relation Age of Onset   CAD Mother    Atrial fibrillation Mother    CAD Father    Sudden Cardiac Death Brother    Stroke Brother 76   Sudden Cardiac Death Brother 5  Breast cancer Neg Hx     ALLERGIES:  is allergic to augmentin [amoxicillin-pot clavulanate].  MEDICATIONS:  Current Outpatient Medications  Medication Sig Dispense Refill   docusate sodium (COLACE) 100 MG capsule Take 1 capsule (100 mg total) by mouth 2 (two) times daily. 60 capsule 1   ferrous sulfate 325 (65 FE) MG EC tablet Take 1 tablet (325 mg total) by mouth 2 (two) times daily with a meal. 60 tablet 2   albuterol (VENTOLIN HFA) 108 (90 Base) MCG/ACT inhaler Inhale 2 puffs into the lungs every 6 (six) hours as needed for wheezing or shortness of breath. 8 g 5   fluticasone (FLONASE) 50 MCG/ACT nasal spray Place  2 sprays into both nostrils daily. 16 g 6   ibuprofen (ADVIL) 600 MG tablet TAKE 1 TABLET(600 MG) BY MOUTH EVERY 8 HOURS AS NEEDED 30 tablet 1   ipratropium (ATROVENT) 0.06 % nasal spray Place 2 sprays into both nostrils 4 (four) times daily. 15 mL 12   lisinopril-hydrochlorothiazide (ZESTORETIC) 20-12.5 MG tablet TAKE 1 TABLET BY MOUTH DAILY 30 tablet 5   Multiple Vitamins-Minerals (MULTIVITAMIN WITH MINERALS) tablet Take 1 tablet by mouth.     potassium chloride (KLOR-CON) 10 MEQ tablet Take 1 tablet (10 mEq total) by mouth 2 (two) times daily. 180 tablet 0   No current facility-administered medications for this visit.     PHYSICAL EXAMINATION: ECOG PERFORMANCE STATUS: 1 - Symptomatic but completely ambulatory Vitals:   04/12/21 1510  BP: 121/86  Pulse: (!) 109  Temp: 98.9 F (37.2 C)   Filed Weights   04/12/21 1510  Weight: 195 lb (88.5 kg)    Physical Exam Constitutional:      General: She is not in acute distress. HENT:     Head: Normocephalic and atraumatic.  Eyes:     General: No scleral icterus. Cardiovascular:     Rate and Rhythm: Normal rate and regular rhythm.     Heart sounds: Normal heart sounds.  Pulmonary:     Effort: Pulmonary effort is normal. No respiratory distress.     Breath sounds: No wheezing.  Abdominal:     General: Bowel sounds are normal. There is no distension.     Palpations: Abdomen is soft.  Musculoskeletal:        General: No deformity. Normal range of motion.     Cervical back: Normal range of motion and neck supple.  Skin:    General: Skin is warm and dry.     Coloration: Skin is pale.     Findings: No erythema or rash.  Neurological:     Mental Status: She is alert and oriented to person, place, and time. Mental status is at baseline.     Cranial Nerves: No cranial nerve deficit.     Coordination: Coordination normal.  Psychiatric:        Mood and Affect: Mood normal.    LABORATORY DATA:  I have reviewed the data as  listed Lab Results  Component Value Date   WBC 4.9 06/13/2020   HGB 8.6 (L) 06/13/2020   HCT 29.4 (L) 06/13/2020   MCV 64.8 (L) 06/13/2020   PLT 353 06/13/2020   Recent Labs    06/13/20 0958 10/19/20 1236  NA 135 135  K 3.1* 3.3*  CL 101 103  CO2 26 25  GLUCOSE 100* 89  BUN 9 13  CREATININE 0.50 0.52  CALCIUM 9.0 8.9  GFRNONAA >60 >60  PROT 8.2*  --   ALBUMIN 3.8  --  AST 19  --   ALT 15  --   ALKPHOS 63  --   BILITOT 0.5  --    Iron/TIBC/Ferritin/ %Sat No results found for: IRON, TIBC, FERRITIN, IRONPCTSAT    RADIOGRAPHIC STUDIES: I have personally reviewed the radiological images as listed and agreed with the findings in the report. No results found.    ASSESSMENT & PLAN:  1. Iron deficiency anemia due to chronic blood loss   2. Menorrhagia with regular cycle    # Iron deficiency anemia Labs are reviewed and discussed with patient. Discussed with patient about option of oral iron supplementation vs IV Venofer Given the severity of anemia, I recommend IV venofer. Recommend her to start oral iron supplementation bridging to the start of IV venfoer.  Plan IV iron with Venofer 200mg  weekly x 5 doses. Risks of infusion reactions including anaphylactic reactions were discussed with patient. Other side effects include but not limited to high blood pressure, headache,wheezing, SOB, skin rash and itchiness, weight gain, leg swelling, etc. Patient voices understanding and is willing to proceed. She has bilateral tubal ligation.   Vitamin B12 deficiency, she has started IM B12 injections, managed by PCP Menorrhagia, recommend her to see gyn for evaluation.   Orders Placed This Encounter  Procedures   CBC with Differential/Platelet    Standing Status:   Future    Standing Expiration Date:   04/12/2022   Iron and TIBC    Standing Status:   Future    Standing Expiration Date:   04/12/2022   Ferritin    Standing Status:   Future    Standing Expiration Date:   04/12/2022    Retic Panel    Standing Status:   Future    Standing Expiration Date:   04/12/2022    All questions were answered. The patient knows to call the clinic with any problems questions or concerns.   Gladstone Lighter, MD    Return of visit: 3 months. Thank you for this kind referral and the opportunity to participate in the care of this patient. A copy of today's note is routed to referring provider   Earlie Server, MD, PhD [facility] Hematology Oncology 04/12/2021

## 2021-04-16 ENCOUNTER — Other Ambulatory Visit: Payer: Self-pay

## 2021-04-16 ENCOUNTER — Inpatient Hospital Stay: Payer: 59

## 2021-04-16 VITALS — BP 101/69 | HR 89 | Temp 97.8°F | Resp 18

## 2021-04-16 DIAGNOSIS — D500 Iron deficiency anemia secondary to blood loss (chronic): Secondary | ICD-10-CM

## 2021-04-16 MED ORDER — SODIUM CHLORIDE 0.9 % IV SOLN: 200.0000 mg | Freq: Once | INTRAVENOUS | Status: DC

## 2021-04-16 MED ORDER — SODIUM CHLORIDE 0.9 % IV SOLN
Freq: Once | INTRAVENOUS | Status: AC
  Filled 2021-04-16: qty 250

## 2021-04-16 MED ORDER — IRON SUCROSE 20 MG/ML IV SOLN
200.0000 mg | Freq: Once | INTRAVENOUS | Status: AC
  Administered 2021-04-16: 200 mg via INTRAVENOUS
  Filled 2021-04-16: qty 10

## 2021-04-16 NOTE — Progress Notes (Signed)
Patient observed for 30 minutes post infusion. Discharged in stable condition.

## 2021-04-23 ENCOUNTER — Other Ambulatory Visit: Payer: Self-pay

## 2021-04-23 ENCOUNTER — Inpatient Hospital Stay: Payer: 59

## 2021-04-23 VITALS — BP 108/70 | HR 80

## 2021-04-23 DIAGNOSIS — D500 Iron deficiency anemia secondary to blood loss (chronic): Secondary | ICD-10-CM | POA: Diagnosis not present

## 2021-04-23 MED ORDER — IRON SUCROSE 20 MG/ML IV SOLN
200.0000 mg | Freq: Once | INTRAVENOUS | Status: AC
  Administered 2021-04-23: 200 mg via INTRAVENOUS
  Filled 2021-04-23: qty 10

## 2021-04-23 MED ORDER — SODIUM CHLORIDE 0.9 % IV SOLN
Freq: Once | INTRAVENOUS | Status: AC
  Filled 2021-04-23: qty 250

## 2021-04-23 MED ORDER — SODIUM CHLORIDE 0.9 % IV SOLN: 200.0000 mg | Freq: Once | INTRAVENOUS | Status: DC

## 2021-04-30 ENCOUNTER — Other Ambulatory Visit: Payer: Self-pay

## 2021-04-30 ENCOUNTER — Inpatient Hospital Stay: Payer: 59 | Attending: Oncology

## 2021-04-30 VITALS — BP 123/82 | HR 92 | Temp 98.7°F

## 2021-04-30 DIAGNOSIS — D500 Iron deficiency anemia secondary to blood loss (chronic): Secondary | ICD-10-CM

## 2021-04-30 DIAGNOSIS — N920 Excessive and frequent menstruation with regular cycle: Secondary | ICD-10-CM | POA: Insufficient documentation

## 2021-04-30 MED ORDER — IRON SUCROSE 20 MG/ML IV SOLN
200.0000 mg | Freq: Once | INTRAVENOUS | Status: AC
  Administered 2021-04-30: 200 mg via INTRAVENOUS
  Filled 2021-04-30: qty 10

## 2021-04-30 MED ORDER — SODIUM CHLORIDE 0.9 % IV SOLN: 200.0000 mg | Freq: Once | INTRAVENOUS | Status: DC

## 2021-04-30 MED ORDER — SODIUM CHLORIDE 0.9 % IV SOLN
Freq: Once | INTRAVENOUS | Status: AC
  Filled 2021-04-30: qty 250

## 2021-05-07 ENCOUNTER — Other Ambulatory Visit: Payer: Self-pay

## 2021-05-07 ENCOUNTER — Inpatient Hospital Stay: Payer: 59

## 2021-05-07 VITALS — BP 100/67 | HR 74 | Temp 97.9°F | Resp 18

## 2021-05-07 DIAGNOSIS — D500 Iron deficiency anemia secondary to blood loss (chronic): Secondary | ICD-10-CM

## 2021-05-07 MED ORDER — IRON SUCROSE 20 MG/ML IV SOLN
200.0000 mg | Freq: Once | INTRAVENOUS | Status: AC
  Administered 2021-05-07: 200 mg via INTRAVENOUS
  Filled 2021-05-07: qty 10

## 2021-05-07 MED ORDER — SODIUM CHLORIDE 0.9 % IV SOLN
Freq: Once | INTRAVENOUS | Status: AC
  Filled 2021-05-07: qty 250

## 2021-05-07 MED ORDER — SODIUM CHLORIDE 0.9 % IV SOLN: 200.0000 mg | Freq: Once | INTRAVENOUS | Status: DC

## 2021-05-14 ENCOUNTER — Inpatient Hospital Stay: Payer: 59

## 2021-05-14 ENCOUNTER — Other Ambulatory Visit: Payer: Self-pay

## 2021-05-14 VITALS — BP 112/71 | HR 92 | Temp 97.4°F | Resp 18

## 2021-05-14 DIAGNOSIS — D500 Iron deficiency anemia secondary to blood loss (chronic): Secondary | ICD-10-CM | POA: Diagnosis not present

## 2021-05-14 MED ORDER — SODIUM CHLORIDE 0.9 % IV SOLN: 200.0000 mg | Freq: Once | INTRAVENOUS | Status: DC

## 2021-05-14 MED ORDER — IRON SUCROSE 20 MG/ML IV SOLN
200.0000 mg | Freq: Once | INTRAVENOUS | Status: AC
  Administered 2021-05-14: 200 mg via INTRAVENOUS
  Filled 2021-05-14: qty 10

## 2021-05-14 MED ORDER — SODIUM CHLORIDE 0.9 % IV SOLN
Freq: Once | INTRAVENOUS | Status: AC
  Filled 2021-05-14: qty 250

## 2021-05-14 NOTE — Patient Instructions (Signed)
[facility]  Discharge Instructions: ?Thank you for choosing [facility] to provide your oncology and hematology care.  ?If you have a lab appointment with the Cancer Center, please go directly to the Cancer Center and check in at the registration area. ? ?Wear comfortable clothing and clothing appropriate for easy access to any Portacath or PICC line.  ? ?We strive to give you quality time with your provider. You may need to reschedule your appointment if you arrive late (15 or more minutes).  Arriving late affects you and other patients whose appointments are after yours.  Also, if you miss three or more appointments without notifying the office, you may be dismissed from the clinic at the provider?s discretion.    ?  ?For prescription refill requests, have your pharmacy contact our office and allow 72 hours for refills to be completed.   ? ?Today you received the following chemotherapy and/or immunotherapy agents VENOFER    ?  ?To help prevent nausea and vomiting after your treatment, we encourage you to take your nausea medication as directed. ? ?BELOW ARE SYMPTOMS THAT SHOULD BE REPORTED IMMEDIATELY: ?*FEVER GREATER THAN 100.4 F (38 ?C) OR HIGHER ?*CHILLS OR SWEATING ?*NAUSEA AND VOMITING THAT IS NOT CONTROLLED WITH YOUR NAUSEA MEDICATION ?*UNUSUAL SHORTNESS OF BREATH ?*UNUSUAL BRUISING OR BLEEDING ?*URINARY PROBLEMS (pain or burning when urinating, or frequent urination) ?*BOWEL PROBLEMS (unusual diarrhea, constipation, pain near the anus) ?TENDERNESS IN MOUTH AND THROAT WITH OR WITHOUT PRESENCE OF ULCERS (sore throat, sores in mouth, or a toothache) ?UNUSUAL RASH, SWELLING OR PAIN  ?UNUSUAL VAGINAL DISCHARGE OR ITCHING  ? ?Items with * indicate a potential emergency and should be followed up as soon as possible or go to the Emergency Department if any problems should occur. ? ?Please show the CHEMOTHERAPY ALERT CARD or IMMUNOTHERAPY ALERT CARD at check-in to the  Emergency Department and triage nurse. ? ?Should you have questions after your visit or need to cancel or reschedule your appointment, please contact [facility]  [phone] and follow the prompts.  Office hours are 8:00 a.m. to 4:30 p.m. Monday - Friday. Please note that voicemails left after 4:00 p.m. may not be returned until the following business day.  We are closed weekends and major holidays. You have access to a nurse at all times for urgent questions. Please call the main number to the clinic [phone] and follow the prompts. ? ?For any non-urgent questions, you may also contact your provider using MyChart. We now offer e-Visits for anyone 18 and older to request care online for non-urgent symptoms. For details visit [email or web address]. ?  ?Also download the MyChart app! Go to the app store, search "MyChart", open the app, select [facility], and log in with your MyChart username and password. ? ?Due to Covid, a mask is required upon entering the hospital/clinic. If you do not have a mask, one will be given to you upon arrival. For doctor visits, patients may have 1 support person aged 18 or older with them. For treatment visits, patients cannot have anyone with them due to current Covid guidelines and our immunocompromised population.  ? ?Iron Sucrose Injection ?What is this medication? ?IRON SUCROSE (EYE ern SOO krose) treats low levels of iron (iron deficiency anemia) in people with kidney disease. Iron is a mineral that plays an important role in making red blood cells, which carry oxygen from your lungs to the rest of your body. ?This medicine may   be used for other purposes; ask your health care provider or pharmacist if you have questions. ?COMMON BRAND NAME(S): Venofer ?What should I tell my care team before I take this medication? ?They need to know if you have any of these conditions: ?Anemia not caused by low iron levels ?Heart disease ?High levels of  iron in the blood ?Kidney disease ?Liver disease ?An unusual or allergic reaction to iron, other medications, foods, dyes, or preservatives ?Pregnant or trying to get pregnant ?Breast-feeding ?How should I use this medication? ?This medication is for infusion into a vein. It is given in a hospital or clinic setting. ?Talk to your care team about the use of this medication in children. While this medication may be prescribed for children as young as 2 years for selected conditions, precautions do apply. ?Overdosage: If you think you have taken too much of this medicine contact a poison control center or emergency room at once. ?NOTE: This medicine is only for you. Do not share this medicine with others. ?What if I miss a dose? ?It is important not to miss your dose. Call your care team if you are unable to keep an appointment. ?What may interact with this medication? ?Do not take this medication with any of the following: ?Deferoxamine ?Dimercaprol ?Other iron products ?This medication may also interact with the following: ?Chloramphenicol ?Deferasirox ?This list may not describe all possible interactions. Give your health care provider a list of all the medicines, herbs, non-prescription drugs, or dietary supplements you use. Also tell them if you smoke, drink alcohol, or use illegal drugs. Some items may interact with your medicine. ?What should I watch for while using this medication? ?Visit your care team regularly. Tell your care team if your symptoms do not start to get better or if they get worse. You may need blood work done while you are taking this medication. ?You may need to follow a special diet. Talk to your care team. Foods that contain iron include: whole grains/cereals, dried fruits, beans, or peas, leafy green vegetables, and organ meats (liver, kidney). ?What side effects may I notice from receiving this medication? ?Side effects that you should report to your care team as soon as  possible: ?Allergic reactions--skin rash, itching, hives, swelling of the face, lips, tongue, or throat ?Low blood pressure--dizziness, feeling faint or lightheaded, blurry vision ?Shortness of breath ?Side effects that usually do not require medical attention (report to your care team if they continue or are bothersome): ?Flushing ?Headache ?Joint pain ?Muscle pain ?Nausea ?Pain, redness, or irritation at injection site ?This list may not describe all possible side effects. Call your doctor for medical advice about side effects. You may report side effects to FDA at 1-800-FDA-1088. ?Where should I keep my medication? ?This medication is given in a hospital or clinic and will not be stored at home. ?NOTE: This sheet is a summary. It may not cover all possible information. If you have questions about this medicine, talk to your doctor, pharmacist, or health care provider. ?? 2022 Elsevier/Gold Standard (2020-07-07 00:00:00) ? ?

## 2021-05-18 DIAGNOSIS — I10 Essential (primary) hypertension: Secondary | ICD-10-CM | POA: Diagnosis not present

## 2021-05-18 DIAGNOSIS — N924 Excessive bleeding in the premenopausal period: Secondary | ICD-10-CM | POA: Diagnosis not present

## 2021-05-18 DIAGNOSIS — D500 Iron deficiency anemia secondary to blood loss (chronic): Secondary | ICD-10-CM | POA: Diagnosis not present

## 2021-05-18 DIAGNOSIS — E538 Deficiency of other specified B group vitamins: Secondary | ICD-10-CM | POA: Diagnosis not present

## 2021-06-18 DIAGNOSIS — E538 Deficiency of other specified B group vitamins: Secondary | ICD-10-CM | POA: Diagnosis not present

## 2021-06-25 DIAGNOSIS — N939 Abnormal uterine and vaginal bleeding, unspecified: Secondary | ICD-10-CM | POA: Diagnosis not present

## 2021-06-25 DIAGNOSIS — D500 Iron deficiency anemia secondary to blood loss (chronic): Secondary | ICD-10-CM | POA: Diagnosis not present

## 2021-07-10 ENCOUNTER — Inpatient Hospital Stay: Payer: 59 | Attending: Oncology

## 2021-07-10 DIAGNOSIS — N920 Excessive and frequent menstruation with regular cycle: Secondary | ICD-10-CM | POA: Insufficient documentation

## 2021-07-10 DIAGNOSIS — D500 Iron deficiency anemia secondary to blood loss (chronic): Secondary | ICD-10-CM | POA: Insufficient documentation

## 2021-07-10 DIAGNOSIS — Z79899 Other long term (current) drug therapy: Secondary | ICD-10-CM | POA: Insufficient documentation

## 2021-07-10 DIAGNOSIS — F17210 Nicotine dependence, cigarettes, uncomplicated: Secondary | ICD-10-CM | POA: Insufficient documentation

## 2021-07-12 ENCOUNTER — Inpatient Hospital Stay: Payer: 59

## 2021-07-12 ENCOUNTER — Encounter: Payer: Self-pay | Admitting: Oncology

## 2021-07-12 ENCOUNTER — Inpatient Hospital Stay (HOSPITAL_BASED_OUTPATIENT_CLINIC_OR_DEPARTMENT_OTHER): Payer: 59 | Admitting: Oncology

## 2021-07-12 VITALS — BP 124/87 | HR 94 | Temp 98.3°F | Resp 18 | Wt 201.9 lb

## 2021-07-12 DIAGNOSIS — D500 Iron deficiency anemia secondary to blood loss (chronic): Secondary | ICD-10-CM

## 2021-07-12 DIAGNOSIS — F17210 Nicotine dependence, cigarettes, uncomplicated: Secondary | ICD-10-CM | POA: Diagnosis not present

## 2021-07-12 DIAGNOSIS — Z79899 Other long term (current) drug therapy: Secondary | ICD-10-CM | POA: Diagnosis not present

## 2021-07-12 DIAGNOSIS — N920 Excessive and frequent menstruation with regular cycle: Secondary | ICD-10-CM

## 2021-07-12 NOTE — Progress Notes (Signed)
Pt here for follow up. No new concerns voiced.   

## 2021-07-12 NOTE — Progress Notes (Signed)
Hematology/Oncology Consult note [phone] [phone]         Patient Care Team: Gladstone Lighter, MD as PCP - General (Internal Medicine) [facility] (Obstetrics and Gynecology) Earlie Server, MD as Consulting Physician (Hematology)  REFERRING PROVIDER: Gladstone Lighter, MD  CHIEF COMPLAINTS/REASON FOR VISIT:   iron deficiency anemia  HISTORY OF PRESENTING ILLNESS:   Theresa Bowen is a  41 y.o.  female with PMH listed below was seen in consultation at the request of  Gladstone Lighter, MD  for evaluation of iron deficiency anemia.   03/29/2021 cbc showed hemoglobin of 7, mcv 61, normal wbc, platelet 411,000, ferritin 4, TIBC 622, saturation 4% B12 228,  She is very tired, lightheaded. She has started IM B12 injection with her PCP.  + heavy menstrual period.  No melena, blood in the stool.   INTERVAL HISTORY Theresa Bowen is a 41 y.o. female who has above history reviewed by me today presents for follow up visit for management of iron deficiency anemia. Since last visit, patient has received IV Venofer treatments.  She tolerates well. Patient reports fatigue has significantly improved.  Review of Systems  Constitutional:  Negative for appetite change, chills, fatigue and fever.  HENT:   Negative for hearing loss and voice change.   Eyes:  Negative for eye problems.  Respiratory:  Negative for chest tightness and cough.   Cardiovascular:  Negative for chest pain.  Gastrointestinal:  Negative for abdominal distention, abdominal pain and blood in stool.  Endocrine: Negative for hot flashes.  Genitourinary:  Positive for menstrual problem. Negative for difficulty urinating and frequency.   Musculoskeletal:  Negative for arthralgias.  Skin:  Negative for itching and rash.  Neurological:  Negative for extremity weakness.  Hematological:  Negative for adenopathy.  Psychiatric/Behavioral:  Negative for confusion.    MEDICAL HISTORY:  Past  Medical History:  Diagnosis Date   Abnormal thyroid function test 07/22/2014   Fibromyalgia    Hyperlipidemia 04/29/2017   Hypertension    Hypokalemia    IDA (iron deficiency anemia) 07/22/2014   Referred otalgia of left ear 12/28/2018    SURGICAL HISTORY: Past Surgical History:  Procedure Laterality Date   c-section  2004   TONSILLECTOMY     TUBAL LIGATION  2016    SOCIAL HISTORY: Social History   Socioeconomic History   Marital status: Divorced    Spouse name: Not on file   Number of children: 2   Years of education: Not on file   Highest education level: Not on file  Occupational History   Not on file  Tobacco Use   Smoking status: Every Day    Packs/day: 0.50    Years: 15.00    Pack years: 7.50    Types: Cigarettes    Start date: 2007   Smokeless tobacco: Never  Vaping Use   Vaping Use: Never used  Substance and Sexual Activity   Alcohol use: No    Alcohol/week: 0.0 standard drinks   Drug use: No   Sexual activity: Yes    Birth control/protection: Surgical  Other Topics Concern   Not on file  Social History Narrative   Not on file   Social Determinants of Health   Financial Resource Strain: Not on file  Food Insecurity: Not on file  Transportation Needs: Not on file  Physical Activity: Not on file  Stress: Not on file  Social Connections: Not on file  Intimate Partner Violence: Not on file    FAMILY HISTORY:  Family History  Problem Relation Age of Onset   CAD Mother    Atrial fibrillation Mother    CAD Father    Sudden Cardiac Death Brother    Stroke Brother 66   Sudden Cardiac Death Brother 51   Breast cancer Neg Hx     ALLERGIES:  is allergic to augmentin [amoxicillin-pot clavulanate].  MEDICATIONS:  Current Outpatient Medications  Medication Sig Dispense Refill   albuterol (VENTOLIN HFA) 108 (90 Base) MCG/ACT inhaler Inhale 2 puffs into the lungs every 6 (six) hours as needed for wheezing or shortness of breath. 8 g 5    cyanocobalamin (,VITAMIN B-12,) 1000 MCG/ML injection Inject into the muscle.     docusate sodium (COLACE) 100 MG capsule Take 1 capsule (100 mg total) by mouth 2 (two) times daily. 60 capsule 1   ergocalciferol (VITAMIN D2) 1.25 MG (50000 UT) capsule Take by mouth.     ferrous sulfate 325 (65 FE) MG EC tablet Take 1 tablet (325 mg total) by mouth 2 (two) times daily with a meal. 60 tablet 2   fluticasone (FLONASE) 50 MCG/ACT nasal spray Place 2 sprays into both nostrils daily. 16 g 6   ibuprofen (ADVIL) 600 MG tablet TAKE 1 TABLET(600 MG) BY MOUTH EVERY 8 HOURS AS NEEDED 30 tablet 1   ipratropium (ATROVENT) 0.06 % nasal spray Place 2 sprays into both nostrils 4 (four) times daily. 15 mL 12   lisinopril-hydrochlorothiazide (ZESTORETIC) 20-12.5 MG tablet TAKE 1 TABLET BY MOUTH DAILY 30 tablet 5   Multiple Vitamins-Minerals (MULTIVITAMIN WITH MINERALS) tablet Take 1 tablet by mouth.     potassium chloride (KLOR-CON) 10 MEQ tablet Take 1 tablet (10 mEq total) by mouth 2 (two) times daily. 180 tablet 0   No current facility-administered medications for this visit.     PHYSICAL EXAMINATION:  Vitals:   07/12/21 1442  BP: 124/87  Pulse: 94  Resp: 18  Temp: 98.3 F (36.8 C)   Filed Weights   07/12/21 1442  Weight: 201 lb 14.4 oz (91.6 kg)    Physical Exam Constitutional:      General: She is not in acute distress. HENT:     Head: Normocephalic and atraumatic.  Eyes:     General: No scleral icterus. Cardiovascular:     Rate and Rhythm: Normal rate and regular rhythm.     Heart sounds: Normal heart sounds.  Pulmonary:     Effort: Pulmonary effort is normal. No respiratory distress.     Breath sounds: No wheezing.  Abdominal:     General: Bowel sounds are normal. There is no distension.     Palpations: Abdomen is soft.  Musculoskeletal:        General: No deformity. Normal range of motion.     Cervical back: Normal range of motion and neck supple.  Skin:    General: Skin is  warm and dry.     Findings: No erythema or rash.  Neurological:     Mental Status: She is alert and oriented to person, place, and time. Mental status is at baseline.     Cranial Nerves: No cranial nerve deficit.     Coordination: Coordination normal.  Psychiatric:        Mood and Affect: Mood normal.    LABORATORY DATA:  I have reviewed the data as listed Lab Results  Component Value Date   WBC 4.9 06/13/2020   HGB 8.6 (L) 06/13/2020   HCT 29.4 (L) 06/13/2020   MCV 64.8 (L) 06/13/2020  PLT 353 06/13/2020   Recent Labs    10/19/20 1236  NA 135  K 3.3*  CL 103  CO2 25  GLUCOSE 89  BUN 13  CREATININE 0.52  CALCIUM 8.9  GFRNONAA >60    Iron/TIBC/Ferritin/ %Sat No results found for: IRON, TIBC, FERRITIN, IRONPCTSAT    RADIOGRAPHIC STUDIES: I have personally reviewed the radiological images as listed and agreed with the findings in the report. No results found.    ASSESSMENT & PLAN:  1. Iron deficiency anemia due to chronic blood loss   2. Menorrhagia with regular cycle    # Iron deficiency anemia Labs are reviewed and discussed with patient. Recent labs done medical normal clinic on 06/25/2021 showed hemoglobin normalized to 12, iron saturation improved to 16.  Ferritin was not done I will hold off additional IV Venofer treatments. Continue oral iron supplementation as maintenance.   Menorrhagia, recommend her to see gyn for evaluation.   Orders Placed This Encounter  Procedures   CBC with Differential/Platelet    Standing Status:   Future    Standing Expiration Date:   07/13/2022   Ferritin    Standing Status:   Future    Standing Expiration Date:   07/13/2022   Iron and TIBC    Standing Status:   Future    Standing Expiration Date:   07/13/2022    All questions were answered. The patient knows to call the clinic with any problems questions or concerns.  cc Gladstone Lighter, MD    Return of visit: 4 months  Earlie Server, MD, PhD [facility]  Hematology Oncology 07/12/2021

## 2021-07-12 NOTE — Progress Notes (Signed)
Per MD no Venofer today 

## 2021-07-17 DIAGNOSIS — D500 Iron deficiency anemia secondary to blood loss (chronic): Secondary | ICD-10-CM | POA: Diagnosis not present

## 2021-07-17 DIAGNOSIS — N920 Excessive and frequent menstruation with regular cycle: Secondary | ICD-10-CM | POA: Diagnosis not present

## 2021-07-20 DIAGNOSIS — E538 Deficiency of other specified B group vitamins: Secondary | ICD-10-CM | POA: Diagnosis not present

## 2021-08-09 DIAGNOSIS — D250 Submucous leiomyoma of uterus: Secondary | ICD-10-CM | POA: Diagnosis not present

## 2021-08-09 DIAGNOSIS — N920 Excessive and frequent menstruation with regular cycle: Secondary | ICD-10-CM | POA: Diagnosis not present

## 2021-08-20 DIAGNOSIS — E538 Deficiency of other specified B group vitamins: Secondary | ICD-10-CM | POA: Diagnosis not present

## 2021-08-20 DIAGNOSIS — J40 Bronchitis, not specified as acute or chronic: Secondary | ICD-10-CM | POA: Diagnosis not present

## 2021-08-20 DIAGNOSIS — J019 Acute sinusitis, unspecified: Secondary | ICD-10-CM | POA: Diagnosis not present

## 2021-08-20 DIAGNOSIS — H60542 Acute eczematoid otitis externa, left ear: Secondary | ICD-10-CM | POA: Diagnosis not present

## 2021-08-30 ENCOUNTER — Encounter
Admission: RE | Admit: 2021-08-30 | Discharge: 2021-08-30 | Disposition: A | Discharge status: Home / Self Care | Payer: 59 | Source: Ambulatory Visit | Attending: Obstetrics and Gynecology | Admitting: Obstetrics and Gynecology

## 2021-08-30 ENCOUNTER — Other Ambulatory Visit: Payer: Self-pay

## 2021-08-30 VITALS — Ht 61.0 in | Wt 203.0 lb

## 2021-08-30 DIAGNOSIS — I10 Essential (primary) hypertension: Secondary | ICD-10-CM

## 2021-08-30 HISTORY — DX: Dyspnea, unspecified: R06.00

## 2021-08-30 NOTE — Patient Instructions (Addendum)
Your procedure is scheduled on: 7/14/ 2023  Report to the Registration Desk on the 1st floor of the [facility]. To find out your arrival time, please call [phone] between 1PM - 3PM on: 09/06/2021  If your arrival time is 6:00 am, do not arrive prior to that time as the [facility] entrance doors do not open until 6:00 am.  REMEMBER: Instructions that are not followed completely may result in serious medical risk, up to and including death; or upon the discretion of your surgeon and anesthesiologist your surgery may need to be rescheduled.  Do not eat food after midnight the night before surgery.  No gum chewing, lozengers or hard candies.  You may however, drink CLEAR liquids up to 2 hours before you are scheduled to arrive for your surgery. Do not drink anything within 2 hours of your scheduled arrival time.  Clear liquids include: - water  - apple juice without pulp - gatorade (not RED colors)  Do NOT drink anything that is not on this list.   TAKE THESE MEDICATIONS THE MORNING OF SURGERY WITH A SIP OF WATER: potassium   Use inhalers on the day of surgery as prescribed and bring to the hospital.    One week prior to surgery: Stop Anti-inflammatories (NSAIDS) such as Advil, Aleve, Ibuprofen, Motrin, Naproxen, Naprosyn and Aspirin based products such as Excedrin, Goodys Powder, BC Powder. Stop ANY OVER THE COUNTER supplements until after surgery like Vit D and Iron You may however, continue to take Tylenol if needed for pain up until the day of surgery.  No Alcohol for 24 hours before or after surgery.  No Smoking including e-cigarettes for 24 hours prior to surgery.  No chewable tobacco products for at least 6 hours prior to surgery.  No nicotine patches on the day of surgery.  Do not use any "recreational" drugs for at least a week prior to your surgery.  Please be advised that the combination of cocaine and anesthesia may have negative outcomes, up to and  including death. If you test positive for cocaine, your surgery will be cancelled.  On the morning of surgery brush your teeth with toothpaste and water, you may rinse your mouth with mouthwash if you wish. Do not swallow any toothpaste or mouthwash.  Use CHG Soap as directed on instruction sheet.  Do not wear jewelry, make-up, hairpins, clips or nail polish.  Do not wear lotions, powders, or perfumes.   Do not shave body from the neck down 48 hours prior to surgery just in case you cut yourself which could leave a site for infection.  Also, freshly shaved skin may become irritated if using the CHG soap.  Contact lenses, hearing aids and dentures may not be worn into surgery.  Do not bring valuables to the hospital. [facility] is not responsible for any missing/lost belongings or valuables.    Notify your doctor if there is any change in your medical condition (cold, fever, infection).  Wear comfortable clothing (specific to your surgery type) to the hospital.  After surgery, you can help prevent lung complications by doing breathing exercises.  Take deep breaths and cough every 1-2 hours. Your doctor may order a device called an Incentive Spirometer to help you take deep breaths.  If you are being admitted to the hospital overnight, leave your suitcase in the car. After surgery it may be brought to your room.  If you are being discharged the day of surgery, you will not be allowed  to drive home. You will need a responsible adult (18 years or older) to drive you home and stay with you that night.   If you are taking public transportation, you will need to have a responsible adult (18 years or older) with you. Please confirm with your physician that it is acceptable to use public transportation.   Please call the [facility] at [phone] if you have any questions about these instructions.  Surgery Visitation Policy:  Patients undergoing a surgery or  procedure may have two family members or support persons with them as long as the person is not COVID-19 positive or experiencing its symptoms.

## 2021-09-03 ENCOUNTER — Encounter: Payer: Self-pay | Admitting: Urgent Care

## 2021-09-03 ENCOUNTER — Encounter
Admission: RE | Admit: 2021-09-03 | Discharge: 2021-09-03 | Disposition: A | Discharge status: Home / Self Care | Payer: 59 | Source: Ambulatory Visit | Attending: Obstetrics and Gynecology | Admitting: Obstetrics and Gynecology

## 2021-09-03 DIAGNOSIS — Z01818 Encounter for other preprocedural examination: Secondary | ICD-10-CM | POA: Diagnosis not present

## 2021-09-03 DIAGNOSIS — I10 Essential (primary) hypertension: Secondary | ICD-10-CM | POA: Diagnosis not present

## 2021-09-03 DIAGNOSIS — N939 Abnormal uterine and vaginal bleeding, unspecified: Secondary | ICD-10-CM

## 2021-09-03 DIAGNOSIS — Z01810 Encounter for preprocedural cardiovascular examination: Secondary | ICD-10-CM | POA: Diagnosis not present

## 2021-09-03 LAB — BASIC METABOLIC PANEL
Anion gap: 10 (ref 5–15)
BUN: 9 mg/dL (ref 6–20)
CO2: 23 mmol/L (ref 22–32)
Calcium: 9.1 mg/dL (ref 8.9–10.3)
Chloride: 104 mmol/L (ref 98–111)
Creatinine, Ser: 0.54 mg/dL (ref 0.44–1.00)
GFR, Estimated: >60 mL/min (ref >60)
Glucose, Bld: 94 mg/dL (ref 70–99)
Potassium: 3.4 mmol/L — ABNORMAL LOW (ref 3.5–5.1)
Sodium: 137 mmol/L (ref 135–145)

## 2021-09-03 LAB — CBC
HCT: 36.7 % (ref 36.0–46.0)
Hemoglobin: 11.9 g/dL — ABNORMAL LOW (ref 12.0–15.0)
MCH: 26.3 pg (ref 26.0–34.0)
MCHC: 32.4 g/dL (ref 30.0–36.0)
MCV: 81 fL (ref 80.0–100.0)
Platelets: 251 10*3/uL (ref 150–400)
RBC: 4.53 MIL/uL (ref 3.87–5.11)
RDW: 14.6 % (ref 11.5–15.5)
WBC: 5 10*3/uL (ref 4.0–10.5)
nRBC: 0 % (ref 0.0–0.2)

## 2021-09-04 LAB — TYPE AND SCREEN
ABO/RH(D): A POS
Antibody Screen: NEGATIVE

## 2021-09-05 ENCOUNTER — Other Ambulatory Visit: Payer: Self-pay | Admitting: Obstetrics and Gynecology

## 2021-09-07 ENCOUNTER — Ambulatory Visit: Payer: 59 | Admitting: Anesthesiology

## 2021-09-07 ENCOUNTER — Encounter: Payer: Self-pay | Admitting: Obstetrics and Gynecology

## 2021-09-07 ENCOUNTER — Encounter
Admission: RE | Disposition: A | Discharge status: Home / Self Care | Payer: Self-pay | Source: Home / Self Care | Attending: Obstetrics and Gynecology

## 2021-09-07 ENCOUNTER — Other Ambulatory Visit: Payer: Self-pay

## 2021-09-07 ENCOUNTER — Ambulatory Visit
Admission: RE | Admit: 2021-09-07 | Discharge: 2021-09-07 | Disposition: A | Discharge status: Home / Self Care | Payer: 59 | Attending: Obstetrics and Gynecology | Admitting: Obstetrics and Gynecology

## 2021-09-07 DIAGNOSIS — N921 Excessive and frequent menstruation with irregular cycle: Secondary | ICD-10-CM | POA: Insufficient documentation

## 2021-09-07 DIAGNOSIS — I10 Essential (primary) hypertension: Secondary | ICD-10-CM | POA: Diagnosis not present

## 2021-09-07 DIAGNOSIS — N838 Other noninflammatory disorders of ovary, fallopian tube and broad ligament: Secondary | ICD-10-CM | POA: Diagnosis not present

## 2021-09-07 DIAGNOSIS — D250 Submucous leiomyoma of uterus: Secondary | ICD-10-CM | POA: Diagnosis not present

## 2021-09-07 DIAGNOSIS — N939 Abnormal uterine and vaginal bleeding, unspecified: Secondary | ICD-10-CM

## 2021-09-07 DIAGNOSIS — D759 Disease of blood and blood-forming organs, unspecified: Secondary | ICD-10-CM | POA: Diagnosis not present

## 2021-09-07 DIAGNOSIS — F17210 Nicotine dependence, cigarettes, uncomplicated: Secondary | ICD-10-CM | POA: Diagnosis not present

## 2021-09-07 DIAGNOSIS — D252 Subserosal leiomyoma of uterus: Secondary | ICD-10-CM | POA: Diagnosis not present

## 2021-09-07 DIAGNOSIS — D649 Anemia, unspecified: Secondary | ICD-10-CM | POA: Diagnosis not present

## 2021-09-07 DIAGNOSIS — Z9851 Tubal ligation status: Secondary | ICD-10-CM | POA: Diagnosis not present

## 2021-09-07 DIAGNOSIS — N920 Excessive and frequent menstruation with regular cycle: Secondary | ICD-10-CM | POA: Diagnosis not present

## 2021-09-07 HISTORY — PX: LAPAROSCOPIC BILATERAL SALPINGECTOMY: SHX5889

## 2021-09-07 HISTORY — PX: LAPAROSCOPIC SUPRACERVICAL HYSTERECTOMY: SHX5399

## 2021-09-07 LAB — POCT PREGNANCY, URINE: Preg Test, Ur: NEGATIVE

## 2021-09-07 LAB — ABO/RH: ABO/RH(D): A POS

## 2021-09-07 SURGERY — HYSTERECTOMY, SUPRACERVICAL, LAPAROSCOPIC
Anesthesia: General

## 2021-09-07 MED ORDER — HYDROMORPHONE HCL 1 MG/ML IJ SOLN
INTRAMUSCULAR | Status: DC | PRN
  Administered 2021-09-07: .2 mg via INTRAVENOUS
  Administered 2021-09-07: .4 mg via INTRAVENOUS

## 2021-09-07 MED ORDER — ACETAMINOPHEN 500 MG PO TABS
1000.0000 mg | ORAL_TABLET | Freq: Four times a day (QID) | ORAL | 0 refills | Status: AC
Start: 2021-09-07 — End: 2021-09-10

## 2021-09-07 MED ORDER — PHENYLEPHRINE HCL (PRESSORS) 10 MG/ML IV SOLN
INTRAVENOUS | Status: DC | PRN
  Administered 2021-09-07 (×2): 160 ug via INTRAVENOUS
  Administered 2021-09-07: 80 ug via INTRAVENOUS

## 2021-09-07 MED ORDER — CEFAZOLIN SODIUM-DEXTROSE 2-4 GM/100ML-% IV SOLN
2.0000 g | INTRAVENOUS | Status: AC
  Administered 2021-09-07: 2 g via INTRAVENOUS

## 2021-09-07 MED ORDER — BUPIVACAINE HCL (PF) 0.5 % IJ SOLN
INTRAMUSCULAR | Status: AC
  Filled 2021-09-07: qty 30

## 2021-09-07 MED ORDER — OXYCODONE HCL 5 MG PO TABS
ORAL_TABLET | ORAL | Status: AC
  Filled 2021-09-07: qty 1

## 2021-09-07 MED ORDER — OXYCODONE HCL 5 MG PO TABS: 5.0000 mg | ORAL_TABLET | ORAL | 0 refills | Status: DC | PRN

## 2021-09-07 MED ORDER — DROPERIDOL 2.5 MG/ML IJ SOLN
0.6250 mg | Freq: Once | INTRAMUSCULAR | Status: AC | PRN
Start: 2021-09-07 — End: 2021-09-07
  Administered 2021-09-07: 0.625 mg via INTRAVENOUS

## 2021-09-07 MED ORDER — ONDANSETRON HCL 4 MG/2ML IJ SOLN
INTRAMUSCULAR | Status: DC | PRN
  Administered 2021-09-07: 4 mg via INTRAVENOUS

## 2021-09-07 MED ORDER — PROPOFOL 10 MG/ML IV BOLUS
INTRAVENOUS | Status: AC
  Filled 2021-09-07: qty 20

## 2021-09-07 MED ORDER — GABAPENTIN 300 MG PO CAPS
ORAL_CAPSULE | ORAL | Status: AC
  Administered 2021-09-07: 300 mg via ORAL
  Filled 2021-09-07: qty 1

## 2021-09-07 MED ORDER — FAMOTIDINE 20 MG PO TABS: 20.0000 mg | ORAL_TABLET | Freq: Once | ORAL | Status: AC

## 2021-09-07 MED ORDER — FAMOTIDINE 20 MG PO TABS
ORAL_TABLET | ORAL | Status: AC
  Administered 2021-09-07: 20 mg via ORAL
  Filled 2021-09-07: qty 1

## 2021-09-07 MED ORDER — PROMETHAZINE HCL 25 MG/ML IJ SOLN: 6.2500 mg | INTRAMUSCULAR | Status: DC | PRN

## 2021-09-07 MED ORDER — LACTATED RINGERS IV SOLN: INTRAVENOUS | Status: DC

## 2021-09-07 MED ORDER — IBUPROFEN 800 MG PO TABS: 800.0000 mg | ORAL_TABLET | Freq: Three times a day (TID) | ORAL | 1 refills | Status: AC

## 2021-09-07 MED ORDER — KETOROLAC TROMETHAMINE 30 MG/ML IJ SOLN
INTRAMUSCULAR | Status: DC | PRN
  Administered 2021-09-07: 30 mg via INTRAVENOUS

## 2021-09-07 MED ORDER — LIDOCAINE HCL (CARDIAC) PF 100 MG/5ML IV SOSY
PREFILLED_SYRINGE | INTRAVENOUS | Status: DC | PRN
  Administered 2021-09-07: 100 mg via INTRAVENOUS

## 2021-09-07 MED ORDER — ROCURONIUM BROMIDE 10 MG/ML (PF) SYRINGE
PREFILLED_SYRINGE | INTRAVENOUS | Status: AC
  Filled 2021-09-07: qty 10

## 2021-09-07 MED ORDER — GABAPENTIN 300 MG PO CAPS: 300.0000 mg | ORAL_CAPSULE | ORAL | Status: AC

## 2021-09-07 MED ORDER — HYDROMORPHONE HCL 1 MG/ML IJ SOLN
INTRAMUSCULAR | Status: AC
  Filled 2021-09-07: qty 1

## 2021-09-07 MED ORDER — POVIDONE-IODINE 10 % EX SWAB
2.0000 | Freq: Once | CUTANEOUS | Status: AC
  Administered 2021-09-07: 2 via TOPICAL

## 2021-09-07 MED ORDER — DEXAMETHASONE SODIUM PHOSPHATE 10 MG/ML IJ SOLN
INTRAMUSCULAR | Status: AC
  Filled 2021-09-07: qty 1

## 2021-09-07 MED ORDER — CEFAZOLIN SODIUM-DEXTROSE 2-4 GM/100ML-% IV SOLN
INTRAVENOUS | Status: AC
  Filled 2021-09-07: qty 100

## 2021-09-07 MED ORDER — OXYCODONE HCL 5 MG/5ML PO SOLN: 5.0000 mg | Freq: Once | ORAL | Status: AC | PRN

## 2021-09-07 MED ORDER — DEXAMETHASONE SODIUM PHOSPHATE 10 MG/ML IJ SOLN
INTRAMUSCULAR | Status: DC | PRN
  Administered 2021-09-07: 10 mg via INTRAVENOUS

## 2021-09-07 MED ORDER — GABAPENTIN 800 MG PO TABS: 400.0000 mg | ORAL_TABLET | Freq: Every day | ORAL | 0 refills | Status: DC

## 2021-09-07 MED ORDER — SUGAMMADEX SODIUM 200 MG/2ML IV SOLN
INTRAVENOUS | Status: DC | PRN
  Administered 2021-09-07: 200 mg via INTRAVENOUS

## 2021-09-07 MED ORDER — DOCUSATE SODIUM 100 MG PO CAPS: 100.0000 mg | ORAL_CAPSULE | Freq: Two times a day (BID) | ORAL | 0 refills | Status: DC

## 2021-09-07 MED ORDER — ORAL CARE MOUTH RINSE: 15.0000 mL | Freq: Once | OROMUCOSAL | Status: AC

## 2021-09-07 MED ORDER — KETOROLAC TROMETHAMINE 30 MG/ML IJ SOLN
INTRAMUSCULAR | Status: AC
  Filled 2021-09-07: qty 1

## 2021-09-07 MED ORDER — FENTANYL CITRATE (PF) 100 MCG/2ML IJ SOLN
INTRAMUSCULAR | Status: DC | PRN
  Administered 2021-09-07: 100 ug via INTRAVENOUS

## 2021-09-07 MED ORDER — MIDAZOLAM HCL 2 MG/2ML IJ SOLN
INTRAMUSCULAR | Status: AC
  Filled 2021-09-07: qty 2

## 2021-09-07 MED ORDER — ACETAMINOPHEN 500 MG PO TABS
1000.0000 mg | ORAL_TABLET | ORAL | Status: AC
Start: 2021-09-07 — End: 2021-09-07

## 2021-09-07 MED ORDER — FENTANYL CITRATE (PF) 100 MCG/2ML IJ SOLN
25.0000 ug | INTRAMUSCULAR | Status: DC | PRN
  Administered 2021-09-07: 50 ug via INTRAVENOUS

## 2021-09-07 MED ORDER — FENTANYL CITRATE (PF) 100 MCG/2ML IJ SOLN
INTRAMUSCULAR | Status: AC
  Filled 2021-09-07: qty 2

## 2021-09-07 MED ORDER — DROPERIDOL 2.5 MG/ML IJ SOLN
INTRAMUSCULAR | Status: AC
  Filled 2021-09-07: qty 2

## 2021-09-07 MED ORDER — MIDAZOLAM HCL 2 MG/2ML IJ SOLN
INTRAMUSCULAR | Status: DC | PRN
  Administered 2021-09-07: 2 mg via INTRAVENOUS

## 2021-09-07 MED ORDER — ACETAMINOPHEN 500 MG PO TABS
ORAL_TABLET | ORAL | Status: AC
  Administered 2021-09-07: 1000 mg via ORAL
  Filled 2021-09-07: qty 2

## 2021-09-07 MED ORDER — OXYCODONE HCL 5 MG PO TABS
5.0000 mg | ORAL_TABLET | Freq: Once | ORAL | Status: AC | PRN
  Administered 2021-09-07: 5 mg via ORAL

## 2021-09-07 MED ORDER — CHLORHEXIDINE GLUCONATE 0.12 % MT SOLN: 15.0000 mL | Freq: Once | OROMUCOSAL | Status: AC

## 2021-09-07 MED ORDER — PROPOFOL 10 MG/ML IV BOLUS
INTRAVENOUS | Status: DC | PRN
  Administered 2021-09-07: 200 mg via INTRAVENOUS

## 2021-09-07 MED ORDER — 0.9 % SODIUM CHLORIDE (POUR BTL) OPTIME
TOPICAL | Status: DC | PRN
  Administered 2021-09-07: 150 mL

## 2021-09-07 MED ORDER — CHLORHEXIDINE GLUCONATE 0.12 % MT SOLN
OROMUCOSAL | Status: AC
  Administered 2021-09-07: 15 mL via OROMUCOSAL
  Filled 2021-09-07: qty 15

## 2021-09-07 MED ORDER — LIDOCAINE HCL (PF) 2 % IJ SOLN
INTRAMUSCULAR | Status: AC
  Filled 2021-09-07: qty 5

## 2021-09-07 MED ORDER — BUPIVACAINE HCL 0.5 % IJ SOLN
INTRAMUSCULAR | Status: DC | PRN
  Administered 2021-09-07: 25 mL

## 2021-09-07 MED ORDER — ACETAMINOPHEN 10 MG/ML IV SOLN: 1000.0000 mg | Freq: Once | INTRAVENOUS | Status: DC | PRN

## 2021-09-07 MED ORDER — ROCURONIUM BROMIDE 100 MG/10ML IV SOLN
INTRAVENOUS | Status: DC | PRN
  Administered 2021-09-07: 20 mg via INTRAVENOUS
  Administered 2021-09-07: 50 mg via INTRAVENOUS

## 2021-09-07 MED ORDER — FENTANYL CITRATE (PF) 100 MCG/2ML IJ SOLN
INTRAMUSCULAR | Status: AC
  Administered 2021-09-07: 50 ug via INTRAVENOUS
  Filled 2021-09-07: qty 2

## 2021-09-07 MED ORDER — ONDANSETRON HCL 4 MG/2ML IJ SOLN
INTRAMUSCULAR | Status: AC
  Filled 2021-09-07: qty 2

## 2021-09-07 SURGICAL SUPPLY — 57 items
ADH SKN CLS APL DERMABOND .7 (GAUZE/BANDAGES/DRESSINGS) ×2
ANCHOR TIS RET SYS 235ML (MISCELLANEOUS) ×1 IMPLANT
BACTOSHIELD CHG 4% 4OZ (MISCELLANEOUS) ×1
BAG DRN RND TRDRP ANRFLXCHMBR (UROLOGICAL SUPPLIES) ×2
BAG LAPAROSCOPIC 12 15 PORT 16 (BASKET) IMPLANT
BAG RETRIEVAL 12/15 (BASKET) ×3
BAG TISS RTRVL C235 10X14 (MISCELLANEOUS) ×2
BAG URINE DRAIN 2000ML AR STRL (UROLOGICAL SUPPLIES) ×3 IMPLANT
BLADE SURG SZ11 CARB STEEL (BLADE) ×4 IMPLANT
CATH FOLEY 2WAY  5CC 16FR (CATHETERS) ×3
CATH FOLEY 2WAY 5CC 16FR (CATHETERS) ×2
CATH URTH 16FR FL 2W BLN LF (CATHETERS) ×2 IMPLANT
CORD MONOPOLAR M/FML 12FT (MISCELLANEOUS) ×3 IMPLANT
DERMABOND ADVANCED (GAUZE/BANDAGES/DRESSINGS) ×1
DERMABOND ADVANCED .7 DNX12 (GAUZE/BANDAGES/DRESSINGS) ×2 IMPLANT
DRSG TEGADERM 2-3/8X2-3/4 SM (GAUZE/BANDAGES/DRESSINGS) ×7 IMPLANT
FILTER LAP SMOKE EVAC STRL (MISCELLANEOUS) ×3 IMPLANT
GAUZE 4X4 16PLY ~~LOC~~+RFID DBL (SPONGE) ×5 IMPLANT
GLOVE BIO SURGEON STRL SZ7 (GLOVE) ×12 IMPLANT
GLOVE SURG SYN 8.0 (GLOVE) ×3 IMPLANT
GLOVE SURG SYN 8.0 PF PI (GLOVE) ×2 IMPLANT
GLOVE SURG UNDER LTX SZ7.5 (GLOVE) ×8 IMPLANT
GOWN STRL REUS W/ TWL LRG LVL3 (GOWN DISPOSABLE) ×6 IMPLANT
GOWN STRL REUS W/ TWL XL LVL3 (GOWN DISPOSABLE) ×2 IMPLANT
GOWN STRL REUS W/TWL LRG LVL3 (GOWN DISPOSABLE) ×18
GOWN STRL REUS W/TWL XL LVL3 (GOWN DISPOSABLE) ×3
IV NS 1000ML (IV SOLUTION) ×3
IV NS 1000ML BAXH (IV SOLUTION) ×2 IMPLANT
KIT PINK PAD W/HEAD ARE REST (MISCELLANEOUS) ×3
KIT PINK PAD W/HEAD ARM REST (MISCELLANEOUS) ×2 IMPLANT
LABEL OR SOLS (LABEL) ×3 IMPLANT
MANIFOLD NEPTUNE II (INSTRUMENTS) ×3 IMPLANT
NS IRRIG 500ML POUR BTL (IV SOLUTION) ×3 IMPLANT
PACK GYN LAPAROSCOPIC (MISCELLANEOUS) ×3 IMPLANT
PAD PREP 24X41 OB/GYN DISP (PERSONAL CARE ITEMS) ×3 IMPLANT
RETRACTOR RING XSMALL (MISCELLANEOUS) IMPLANT
RTRCTR WOUND ALEXIS 13CM XS SH (MISCELLANEOUS) ×3
SCRUB CHG 4% DYNA-HEX 4OZ (MISCELLANEOUS) ×2 IMPLANT
SET TUBE SMOKE EVAC HIGH FLOW (TUBING) ×3 IMPLANT
SHEARS HARMONIC ACE PLUS 36CM (ENDOMECHANICALS) ×1 IMPLANT
SLEEVE ENDOPATH XCEL 5M (ENDOMECHANICALS) ×3 IMPLANT
SOL SCRUB PVP POV-IOD 4OZ 7.5% (MISCELLANEOUS) ×3
SOLUTION SCRB POV-IOD 4OZ 7.5% (MISCELLANEOUS) ×2 IMPLANT
SPONGE GAUZE 2X2 8PLY STRL LF (GAUZE/BANDAGES/DRESSINGS) ×1 IMPLANT
STRIP CLOSURE SKIN 1/2X4 (GAUZE/BANDAGES/DRESSINGS) ×3 IMPLANT
SUT MNCRL AB 4-0 PS2 18 (SUTURE) ×3 IMPLANT
SUT VIC AB 0 CT1 27 (SUTURE) ×3
SUT VIC AB 0 CT1 27XCR 8 STRN (SUTURE) ×2 IMPLANT
SUT VIC AB 2-0 UR6 27 (SUTURE) ×3 IMPLANT
SUT VIC AB 4-0 SH 27 (SUTURE) ×6
SUT VIC AB 4-0 SH 27XANBCTRL (SUTURE) ×4 IMPLANT
SYR 10ML LL (SYRINGE) ×3 IMPLANT
TROCAR ENDO BLADELESS 11MM (ENDOMECHANICALS) ×1 IMPLANT
TROCAR XCEL NON-BLD 5MMX100MML (ENDOMECHANICALS) ×3 IMPLANT
TROCAR XCEL UNIV SLVE 11M 100M (ENDOMECHANICALS) ×5 IMPLANT
TUBING EVAC SMOKE HEATED PNEUM (TUBING) ×3 IMPLANT
WATER STERILE IRR 500ML POUR (IV SOLUTION) ×3 IMPLANT

## 2021-09-07 NOTE — Discharge Instructions (Addendum)
Discharge instructions after   laparoscopic supracervical hysterectomy And bilateral salpingectomy   For the next three days, take ibuprofen and acetaminophen on a schedule, every 8 hours. You can take them together or you can intersperse them, and take one every four hours. I also gave you gabapentin for nighttime, to help you sleep and also to control pain. Take gabapentin medicines at night for at least the next 3 nights. You also have a narcotic, oxycodone, to take as needed if the above medicines don't help.  Postop constipation is a major cause of pain. Stay well hydrated, walk as you tolerate, and take over the counter senna as well as stool softeners if you need them.    Signs and Symptoms to Report Call our office at [phone] if you have any of the following.   Fever over 100.4 degrees or higher  Severe stomach pain not relieved with pain medications  Bright red bleeding that's heavier than a period that does not slow with rest  To go the bathroom a lot (frequency), you can't hold your urine (urgency), or it hurts when you empty your bladder (urinate)  Chest pain  Shortness of breath  Pain in the calves of your legs  Severe nausea and vomiting not relieved with anti-nausea medications  Signs of infection around your wounds, such as redness, hot to touch, swelling, green/yellow drainage (like pus), bad smelling discharge  Any concerns  What You Can Expect after Surgery  You may see some pink tinged, bloody fluid and bruising around the wound. This is normal.  You may notice shoulder and neck pain. This is caused by the gas used during surgery to expand your abdomen so your surgeon could get to the uterus easier.  You may have a sore throat because of the tube in your mouth during general anesthesia. This will go away in 2 to 3 days.  You may have some stomach cramps.  You may notice spotting on your panties.  You may have pain around the incision sites.   Activities  after Your Discharge Follow these guidelines to help speed your recovery at home:  Do the coughing and deep breathing as you did in the hospital for 2 weeks. Use the small blue breathing device, called the incentive spirometer for 2 weeks.  Don't drive if you are in pain or taking narcotic pain medicine. You may drive when you can safely slam on the brakes, turn the wheel forcefully, and rotate your torso comfortably. This is typically 1-2 weeks. Practice in a parking lot or side street prior to attempting to drive regularly.   Ask others to help with household chores for 4 weeks.  Do not lift anything heavier that 10 pounds for 4-6 weeks. This includes pets, children, and groceries.  Don't do strenuous activities, exercises, or sports like vacuuming, tennis, squash, etc. until your doctor says it is safe to do so. ---Maintain pelvic rest for 8 weeks. This means nothing in the vagina or rectum at all (no douching, tampons, intercourse) for 8 weeks.   Walk as you feel able. Rest often since it may take two or three weeks for your energy level to return to normal.   You may climb stairs  Avoid constipation:   -Eat fruits, vegetables, and whole grains. Eat small meals as your appetite will take time to return to normal.   -Drink 6 to 8 glasses of water each day unless your doctor has told you to limit your fluids.   -  Use a laxative or stool softener as needed if constipation becomes a problem. You may take Miralax, metamucil, Citrucil, Colace, Senekot, FiberCon, etc. If this does not relieve the constipation, try two tablespoons of Milk Of Magnesia every 8 hours until your bowels move.   You may shower. Gently wash the wounds with a mild soap and water. Pat dry.  Do not get in a hot tub, swimming pool, etc. for 6 weeks.  Do not use lotions, oils, powders on the wounds.  Do not douche, use tampons, or have sex until your doctor says it is okay.  Take your pain medicine when you need it. The medicine  may not work as well if the pain is bad.  Take the medicines you were taking before surgery. Other medications you will need are pain medications and possibly constipation and nausea medications (Zofran).   AMBULATORY SURGERY  DISCHARGE INSTRUCTIONS   The drugs that you were given will stay in your system until tomorrow so for the next 24 hours you should not:  Drive an automobile Make any legal decisions Drink any alcoholic beverage   You may resume regular meals tomorrow.  Today it is better to start with liquids and gradually work up to solid foods.  You may eat anything you prefer, but it is better to start with liquids, then soup and crackers, and gradually work up to solid foods.   Please notify your doctor immediately if you have any unusual bleeding, trouble breathing, redness and pain at the surgery site, drainage, fever, or pain not relieved by medication.      Please contact your physician with any problems or Same Day Surgery at [phone], Monday through Friday 6 am to 4 pm, or  at [facility] number at [phone].

## 2021-09-07 NOTE — Anesthesia Preprocedure Evaluation (Addendum)
Anesthesia Evaluation  Patient identified by MRN, date of birth, ID band Patient awake    Reviewed: Allergy & Precautions, NPO status , Patient's Chart, lab work & pertinent test results  Airway Mallampati: II  TM Distance: >3 FB Neck ROM: full    Dental no notable dental hx.    Pulmonary Current Smoker and Patient abstained from smoking.,    Pulmonary exam normal        Cardiovascular hypertension, Pt. on medications Normal cardiovascular exam     Neuro/Psych PSYCHIATRIC DISORDERS Anxiety Depression  Neuromuscular disease    GI/Hepatic Neg liver ROS, GERD  Controlled and Medicated,  Endo/Other  negative endocrine ROS  Renal/GU      Musculoskeletal   Abdominal (+) + obese,   Peds  Hematology  (+) Blood dyscrasia, anemia ,   Anesthesia Other Findings Past Medical History: 07/22/2014: Abnormal thyroid function test No date: Dyspnea 04/29/2017: Hyperlipidemia No date: Hypertension No date: Hypokalemia 07/22/2014: IDA (iron deficiency anemia) 12/28/2018: Referred otalgia of left ear  Past Surgical History: 2004: c-section No date: TONSILLECTOMY 2016: TUBAL LIGATION  BMI    Body Mass Index: 38.36 kg/m      Reproductive/Obstetrics negative OB ROS                           Anesthesia Physical Anesthesia Plan  ASA: 2  Anesthesia Plan: General ETT   Post-op Pain Management: Gabapentin PO (pre-op)*, Toradol IV (intra-op)* and Tylenol PO (pre-op)*   Induction: Intravenous  PONV Risk Score and Plan: 3 and Ondansetron, Midazolam and Dexamethasone  Airway Management Planned: Oral ETT  Additional Equipment:   Intra-op Plan:   Post-operative Plan: Extubation in OR  Informed Consent: I have reviewed the patients History and Physical, chart, labs and discussed the procedure including the risks, benefits and alternatives for the proposed anesthesia with the patient or authorized  representative who has indicated his/her understanding and acceptance.     Dental Advisory Given  Plan Discussed with: Anesthesiologist, CRNA and Surgeon  Anesthesia Plan Comments:        Anesthesia Quick Evaluation

## 2021-09-07 NOTE — Anesthesia Postprocedure Evaluation (Signed)
Anesthesia Post Note  Patient: MARC SIVERTSEN  Procedure(s) Performed: LAPAROSCOPIC SUPRACERVICAL HYSTERECTOMY LAPAROSCOPIC BILATERAL SALPINGECTOMY (Bilateral)  Patient location during evaluation: PACU Anesthesia Type: General Level of consciousness: awake and alert Pain management: pain level controlled Vital Signs Assessment: post-procedure vital signs reviewed and stable Respiratory status: spontaneous breathing, nonlabored ventilation and respiratory function stable Cardiovascular status: blood pressure returned to baseline and stable Postop Assessment: no apparent nausea or vomiting Anesthetic complications: no   No notable events documented.   Last Vitals:  Vitals:   09/07/21 1347 09/07/21 1415  BP: 123/79 120/80  Pulse: 74 72  Resp: 16 16  Temp: (!) 36.3 C (!) 36.3 C  SpO2: 94% 96%    Last Pain:  Vitals:   09/07/21 1415  TempSrc: Temporal  PainSc:                  Iran Ouch

## 2021-09-07 NOTE — Op Note (Signed)
Theresa Bowen PROCEDURE DATE: 09/07/2021  PREOPERATIVE DIAGNOSIS: Abnormal uterine bleeding, fibroid uterus POSTOPERATIVE DIAGNOSIS: The same PROCEDURE: LAPAROSCOPIC SUPRACERVICAL HYSTERECTOMY LAPAROSCOPIC BILATERAL SALPINGECTOMY (Bilateral); contained uterine morcellation in a bag using the eXcite technique. SURGEON:  Dr. Benjaman Kindler, MD ASSISTANT: Dr. Prentice Docker, MD  Anesthesiologist:  Anesthesiologist: [place] Ouch, MD CRNA: Tollie Eth, CRNA; Natasha Mead, CRNA  INDICATIONS: 41 y.o. [phone]  here for definitive surgical management secondary to the indications listed under preoperative diagnoses; please see preoperative note for further details.  Risks of surgery were discussed with the patient including but not limited to: bleeding which may require transfusion or reoperation; infection which may require antibiotics; injury to bowel, bladder, ureters or other surrounding organs; need for additional procedures; thromboembolic phenomenon, incisional problems and other postoperative/anesthesia complications. Written informed consent was obtained.    FINDINGS:  Enlarged uterus with normal bilateral adenxa, minimal pelvic adhesive disease. Normal upper abdomen.  ANESTHESIA:    General INTRAVENOUS FLUIDS:see anesthesia ESTIMATED BLOOD LOSS:20 ml URINE OUTPUT: see anesthesia   SPECIMENS: Uterus, bilateral fallopian tubes  COMPLICATIONS: None immediate  PROCEDURE IN DETAIL:  The patient received prophalactic intravenous antibiotics and had sequential compression devices applied to her lower extremities while in the preoperative area.  She was then taken to the operating room where general anesthesia was administered and was found to be adequate.  She was placed in the dorsal lithotomy position, and was prepped and draped in a sterile manner.  A formal time out was performed with all team members present and in agreement.  A V-care uterine manipulator was placed at  this time.  A Foley catheter was inserted into her bladder and attached to constant drainage. Attention was turned to the abdomen and 0.5% Marcaine infused subq. A 74JO umbilical incision was made with the scalpel.  The Optiview 11-mm trocar and sleeve were then advanced without difficulty with the laparoscope under direct visualization into the abdomen.  The abdomen was then insufflated with carbon dioxide gas and adequate pneumoperitoneum was obtained.  A survey of the patient's pelvis and abdomen revealed the findings above.  Bilateral lower quadrant ports (5 mm on the right and 5 mm on the left) were then placed under direct visualization.  The pelvis was then carefully examined.  Attention was turned to the fallopian tubes; these were freed from the underlying mesosalpinx and the uterine attachments using the Harmonic device.  The bilateral round and broad ligaments were then clamped and transected with the Harmonic device.  The uterine artery was then skeletonized and a small bladder flap was created.  The ureters were noted to be safely away from the area of dissection.  Bleeding was controlled with a fulgurator.  On the right, the pedunculated fundal fibroid was removed using a mixture of harmonic cauterization and fulguration bipolar cauterization.  At this point, attention was turned to the uterine vessels, which were clamped and cauterized on the left, and then the right. After the uterine blood flow at the level of the internal os was controlled, both arteries were cut.  Good hemostasis was noted overall. The right uterine artery insertion was the site of bleeding requiring attention, and most of the blood loss came from this site.  The harmonic was used to cut through the cervix at the level of the internal loss.  The V-care manipulator was removed from the cervix when it was encountered. The fulgurator was used to cauterize the endocervical canal from above.    Attention was returned to  the  abdomen.The ureters were reexamined bilaterally and were pulsating normally.   A large bag was placed in the umbilical port site and the uterus scooped into it.  This bag was brought through the umbilical port site, which was extended to accommodate the bag and a small Alexis retractor.  Using the excite technique, the uterus was removed while fully encased in its bag.  The trochars were replaced, and the removed pedunculated fibroid was found, placed in a bag, and removed through the umbilical port using the excite technique.  The abdominal pressure was reduced and hemostasis was confirmed.  Some oozing was noticed on the right uterine, and this was tamponaded using fibrillar.  The pressure was again reduced and no oozing noted the pelvis was irrigated.  The 32m port fasciae was closed with vertical mattress with 0-Vicryl, using the cone closure system. All trocars were removed under direct visualization, and the abdomen was desufflated.  The umbilical fascia was closed in a running fashion under direct visualization.    All skin incisions were closed with 4-0 Vicryl subcuticular stitches and Dermabond.  Tegaderm was placed above the incisions.  The patient tolerated the procedures well.  All instruments, needles, and sponge counts were correct x 2. The patient was taken to the recovery room awake, extubated and in stable condition.

## 2021-09-07 NOTE — Progress Notes (Signed)
This Rn received a call from this pt's relative stating that the pharmacy did not received the gabapentin order, but were able to get the other prescriptions,(ibuprofen, oxycodone, tylenol, and docusate sodium). This Rn called the pharmacy to verify and spoke to a pharmacy tech whom confirmed that they did not received it. I spoke with Dr Leafy Ro which she stated that as long as the pt got the other prescriptions, she should be ok. I attempted to call several times to this pt and her relative, but got no response. Continue to monitor.

## 2021-09-07 NOTE — H&P (Signed)
Theresa Bowen is a 41 y.o. female here for Follow-up   Referring provider: Genice Rouge*   History of Present Illness: Patient returns today to discuss results of EMBx, Saline ultrasound and next steps for bleeding control.    At the time of her saline, she was on second day of her period, bleeding signficiantly with bleeding extending into the sterile syringe. Mass in endometrium that appeared consistent with a submucosal fibroid in endometrial lining, very likely the cause of her heavy bleeding.    Today: No questions at the moment    SIS 06/2021 - Uterus: 7.9 x 5.05 x 4.53 cm  - Ovaries: normal  - Other: Lt posterior fibroid 1.67 cm, Endometrium 10.27 mm    Endometrial Biopsy 06/2021:  SCANT FRAGMENTED WEAKLY PROLIFERATIVE ENDOMETRIUM, SHOWING EXTENSIVE STROMAL AND GLANDULAR BREAKDOWN, MIXED MAINLY OF BLOOD. NO HYPERPLASIA OR CARCINOMA IN THE SUBMITTED BIOPSY SAMPLE.    Pertinent Hx: - Anemia unresolved with iron infusions x5, heavy menorrhagia with extreme clotting, has tried lysteda without success in March.  - Cesarean x 2  - Fibroid uterus  - Tubal for contraception  - Last pap 04/2019 neg/neg    Past Medical History:  has a past medical history of Abnormal finding on thyroid function test (07/22/2014), Anemia, iron deficiency (07/22/2014), Bleeding disorder (CMS-HCC), Essential (primary) hypertension (07/22/2014), Fibromyalgia, Hyperlipidemia, mild (04/29/2017), Hypertension, and Low serum potassium.  Past Surgical History:  has a past surgical history that includes Cesarean section and Tonsillectomy. Family History: family history includes High blood pressure (Hypertension) in her mother; No Known Problems in her father. Social History:  reports that she has been smoking cigarettes. She has a 8.50 pack-year smoking history. She has never used smokeless tobacco. She reports that she does not currently use alcohol. She reports that she does not use drugs. OB/GYN History:   OB History       Gravida  4   Para      Term      Preterm      AB  1   Living  1        SAB      IAB      Ectopic  1   Molar      Multiple      Live Births  1           Allergies: is allergic to amoxicillin-pot clavulanate. Medications: Current Outpatient Medications:    acetaminophen (TYLENOL) 325 MG tablet, Take 650 mg by mouth every 6 (six) hours as needed, Disp: , Rfl:    albuterol 90 mcg/actuation inhaler, Inhale 2 inhalations into the lungs every 4 (four) hours as needed, Disp: 6.7 g, Rfl: 0   ferrous sulfate 325 (65 FE) MG EC tablet, Take 325 mg by mouth 2 (two) times daily with meals, Disp: , Rfl:    fluticasone propionate (FLONASE) 50 mcg/actuation nasal spray, Place 1 spray into both nostrils 2 (two) times daily, Disp: 16 g, Rfl: 0   lisinopriL-hydroCHLOROthiazide (ZESTORETIC) 20-12.5 mg tablet, Take 1 tablet by mouth once daily, Disp: 30 tablet, Rfl: 3   potassium chloride (KLOR-CON) 10 MEQ ER tablet, Take 1 tablet (10 mEq total) by mouth 2 (two) times daily, Disp: 180 tablet, Rfl: 1   tranexamic acid (LYSTEDA) 650 mg tablet, Take 1 tablet (650 mg total) by mouth 3 (three) times daily Take for a maximum of 5 days during monthly menstruation., Disp: 30 tablet, Rfl: 0   docusate (COLACE) 100 MG capsule, Take 100 mg  by mouth 2 (two) times daily (Patient not taking: Reported on 06/25/2021), Disp: , Rfl:    ergocalciferol, vitamin D2, 1,250 mcg (50,000 unit) capsule, Take 1 capsule (50,000 Units total) by mouth once a week for 30 days, Disp: 12 capsule, Rfl: 1   Current Facility-Administered Medications:    cyanocobalamin (VITAMIN B12) injection 1,000 mcg, 1,000 mcg, Intramuscular, Q30 Days, Kalisetti, Radhika, MD, 1,000 mcg at 07/20/21 1539   Review of Systems: No SOB, no palpitations or chest pain, no new lower extremity edema, no nausea or vomiting or bowel or bladder complaints. See HPI for gyn specific ROS.    Exam:    BP 113/79   Pulse 97   Ht 154.9  cm ('5\' 1"'$ )   Wt 92.1 kg (203 lb)   LMP 07/17/2021 (Exact Date)   BMI 38.36 kg/m     Constitutional:  General appearance: Well nourished, well developed female in no acute distress.  CV: RRR Lungs: CTAB Neuro/psych:  Normal mood and affect. No gross motor deficits. Neck:  Supple, normal appearance.  Respiratory:  Normal respiratory effort, no use of accessory muscles Skin:  No visible rashes or external lesions    Pelvic: deferred    Impression:    The primary encounter diagnosis was Excessive or frequent menstruation. A diagnosis of Submucous leiomyoma of uterus was also pertinent to this visit.   Plan:    - Menorrhagia, Excessive Menstrual Bleeding  - Today we discussed the results of her endometrial biopsy and her saline sonography. While benign findings, her bleeding was extremely heavy and interferes with her life. I discussed with her the various options for bleeding control.    Hormonal: pill, patch, ring, injection, implant and IUD Procedures: Endometrial ablation, Uterine artery embolization Surgical: Fibroid Myomectomy, Hysterectomy    - We discussed all of the above options and various combinations of them to control her bleeding and best accommodate her concerns and needs.    She has decided on hysterectomy and would like to keep her cervix. She is aware of the possibility of continued bleeding. R/b/a/discussed.    Diagnoses and all orders for this visit:   Excessive or frequent menstruation   Submucous leiomyoma of uterus

## 2021-09-07 NOTE — Transfer of Care (Signed)
Immediate Anesthesia Transfer of Care Note  Patient: Theresa Bowen  Procedure(s) Performed: LAPAROSCOPIC SUPRACERVICAL HYSTERECTOMY LAPAROSCOPIC BILATERAL SALPINGECTOMY (Bilateral)  Patient Location: PACU  Anesthesia Type:General  Level of Consciousness: drowsy  Airway & Oxygen Therapy: Patient Spontanous Breathing and Patient connected to face mask oxygen  Post-op Assessment: Report given to RN and Post -op Vital signs reviewed and stable  Post vital signs: stable  Last Vitals:  Vitals Value Taken Time  BP 112/60 09/07/21 1225  Temp 36.2 C 09/07/21 1225  Pulse 90 09/07/21 1228  Resp 15 09/07/21 1228  SpO2 100 % 09/07/21 1228  Vitals shown include unvalidated device data.  Last Pain:  Vitals:   09/07/21 0628  TempSrc: Oral         Complications: No notable events documented.

## 2021-09-07 NOTE — Anesthesia Procedure Notes (Signed)
Procedure Name: Intubation Date/Time: 09/07/2021 9:33 AM  Performed by: Natasha Mead, CRNAPre-anesthesia Checklist: Patient identified, Emergency Drugs available, Suction available and Patient being monitored Patient Re-evaluated:Patient Re-evaluated prior to induction Oxygen Delivery Method: Circle system utilized Preoxygenation: Pre-oxygenation with 100% oxygen Induction Type: IV induction Ventilation: Mask ventilation without difficulty Laryngoscope Size: Miller and 2 Grade View: Grade II Tube type: Oral Tube size: 7.0 mm Number of attempts: 1 Airway Equipment and Method: Stylet and Oral airway Placement Confirmation: ETT inserted through vocal cords under direct vision, positive ETCO2 and breath sounds checked- equal and bilateral Secured at: 22 cm Tube secured with: Tape Dental Injury: Teeth and Oropharynx as per pre-operative assessment

## 2021-09-11 LAB — SURGICAL PATHOLOGY

## 2021-10-17 DIAGNOSIS — D500 Iron deficiency anemia secondary to blood loss (chronic): Secondary | ICD-10-CM | POA: Diagnosis not present

## 2021-10-17 DIAGNOSIS — E538 Deficiency of other specified B group vitamins: Secondary | ICD-10-CM | POA: Diagnosis not present

## 2021-10-17 DIAGNOSIS — I10 Essential (primary) hypertension: Secondary | ICD-10-CM | POA: Diagnosis not present

## 2021-10-17 DIAGNOSIS — R1031 Right lower quadrant pain: Secondary | ICD-10-CM | POA: Diagnosis not present

## 2021-10-17 DIAGNOSIS — E559 Vitamin D deficiency, unspecified: Secondary | ICD-10-CM | POA: Diagnosis not present

## 2021-11-12 ENCOUNTER — Inpatient Hospital Stay: Payer: 59 | Attending: Internal Medicine

## 2021-11-14 MED FILL — Iron Sucrose Inj 20 MG/ML (Fe Equiv): INTRAVENOUS | Qty: 10 | Status: AC

## 2021-11-15 ENCOUNTER — Inpatient Hospital Stay: Payer: 59

## 2021-11-15 ENCOUNTER — Inpatient Hospital Stay: Payer: 59 | Admitting: Oncology

## 2022-01-22 DIAGNOSIS — J019 Acute sinusitis, unspecified: Secondary | ICD-10-CM | POA: Diagnosis not present

## 2022-02-15 IMAGING — MG DIGITAL DIAGNOSTIC BILAT W/ TOMO W/ CAD
8 of 14 series · 8 of 40 positions shown · non-contrast
Comparison: Previous exam(s).

CLINICAL DATA: The patient was called back for possible distortion
and a possible asymmetry on the right. The patient was called back
for a possible mass on the left, only seen on the MLO view.

EXAM:
DIGITAL DIAGNOSTIC BILATERAL MAMMOGRAM WITH TOMOSYNTHESIS AND CAD;
ULTRASOUND LEFT BREAST LIMITED
TECHNIQUE: Bilateral digital diagnostic mammography and breast tomosynthesis
was performed. The images were evaluated with computer-aided
detection.; Targeted ultrasound examination of the left breast was
performed.

[L MLO synth-2D (1 of 3)]
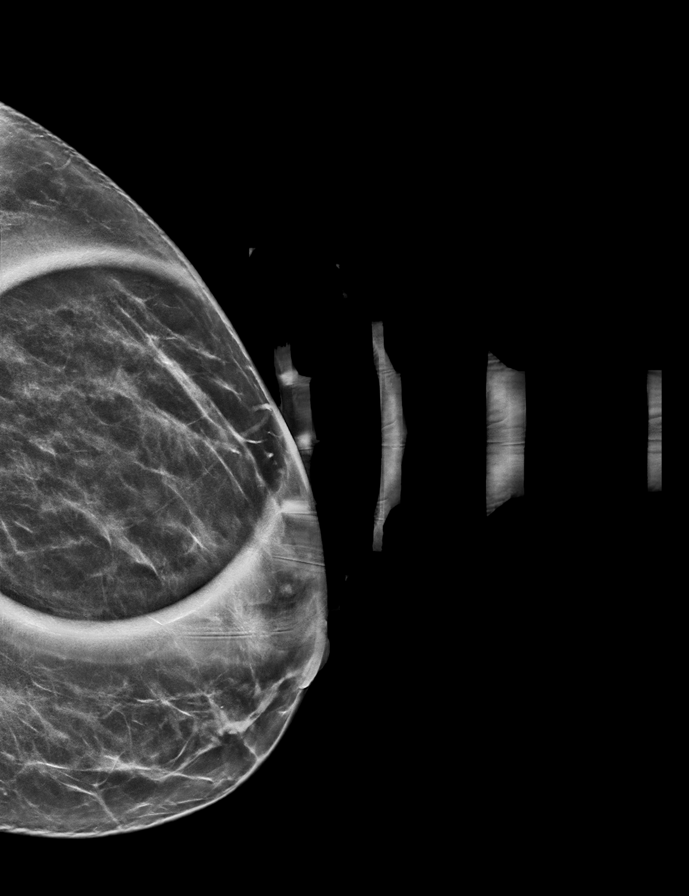

[L MLO synth-2D (2 of 3)]
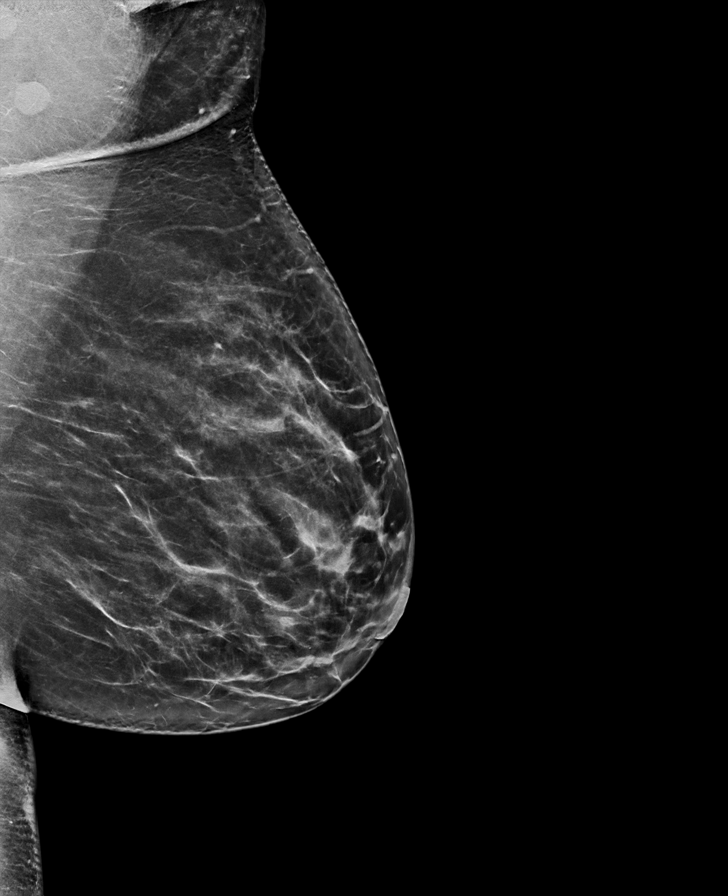

[R ML synth-2D (1 of 2)]
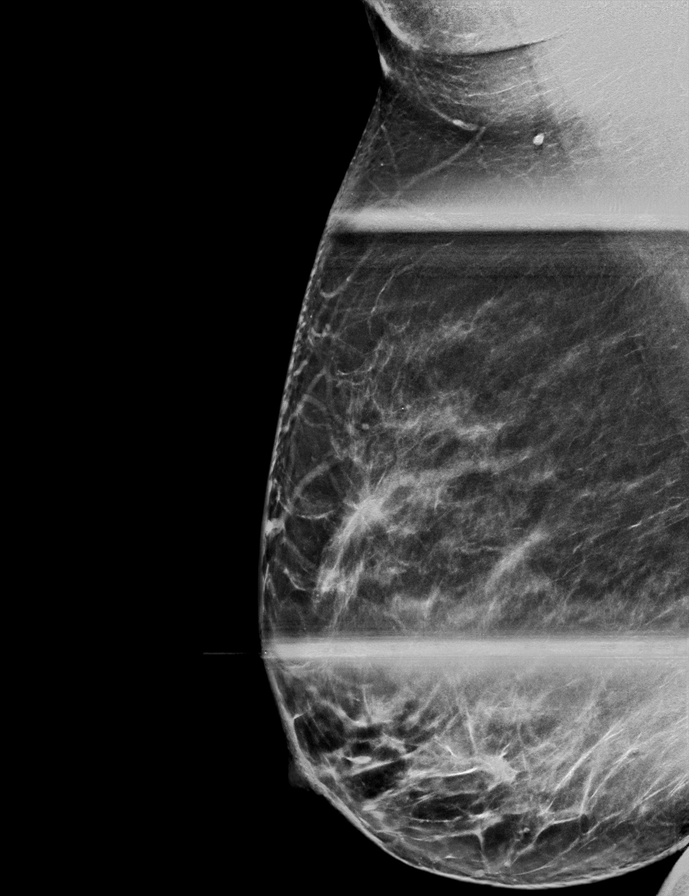

[L ML synth-2D]
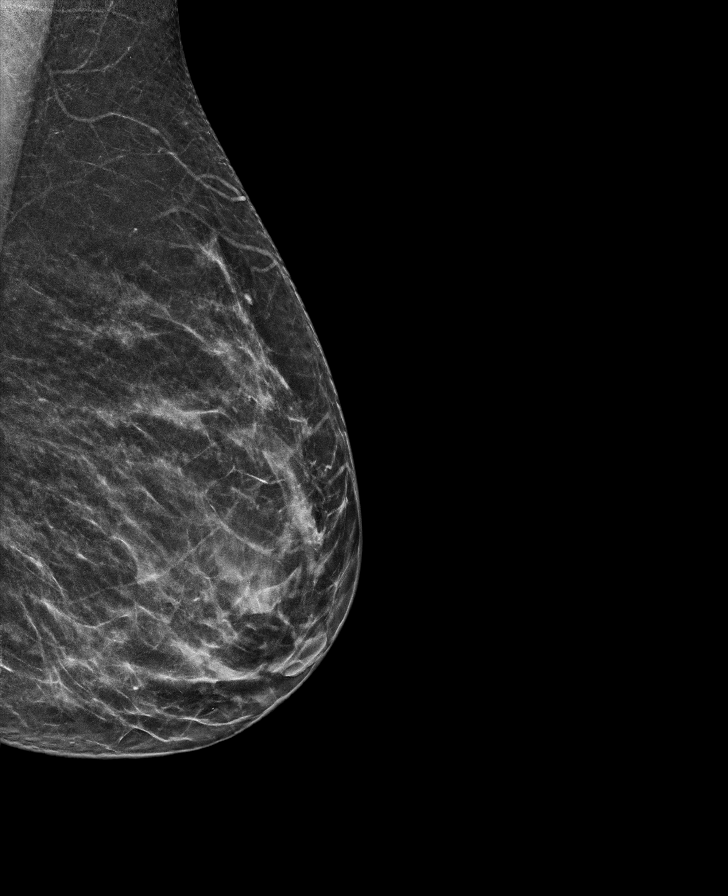

[L MLO synth-2D (3 of 3)]
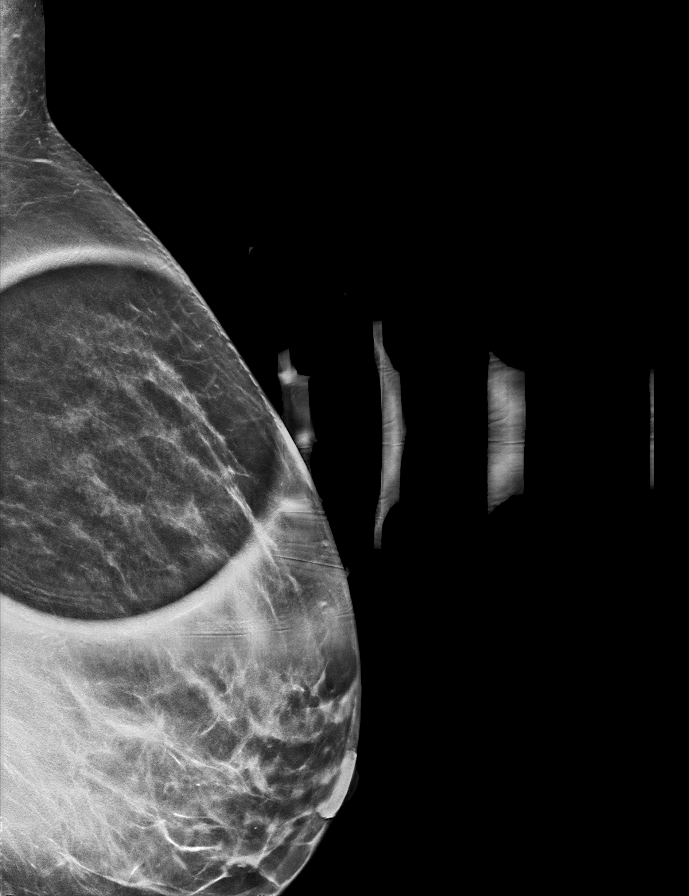

[R ML synth-2D (2 of 2)]
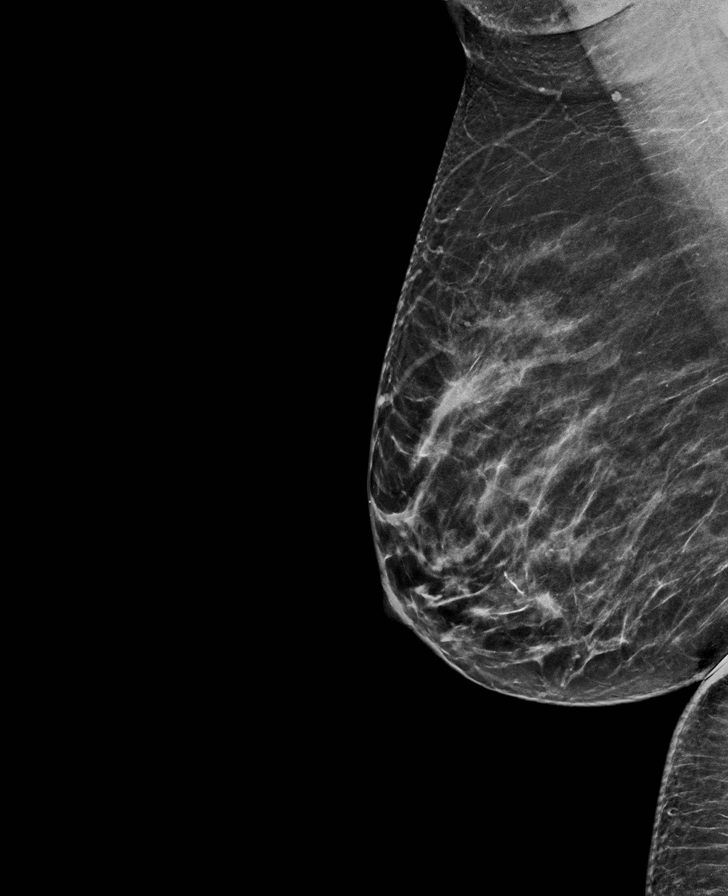

[R CC synth-2D]
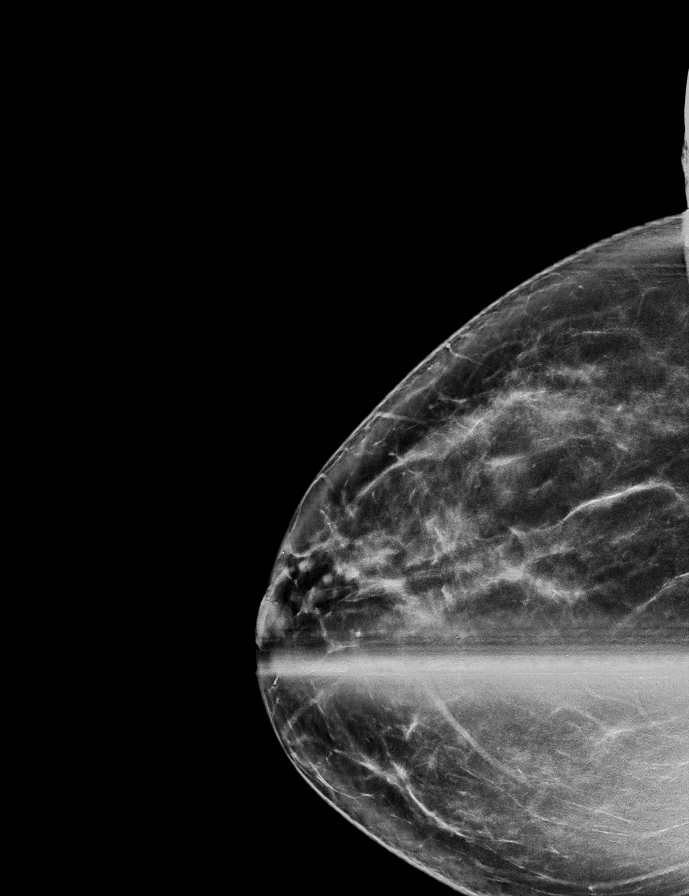

[L MLO tomo · tomo slice 33/65.0]
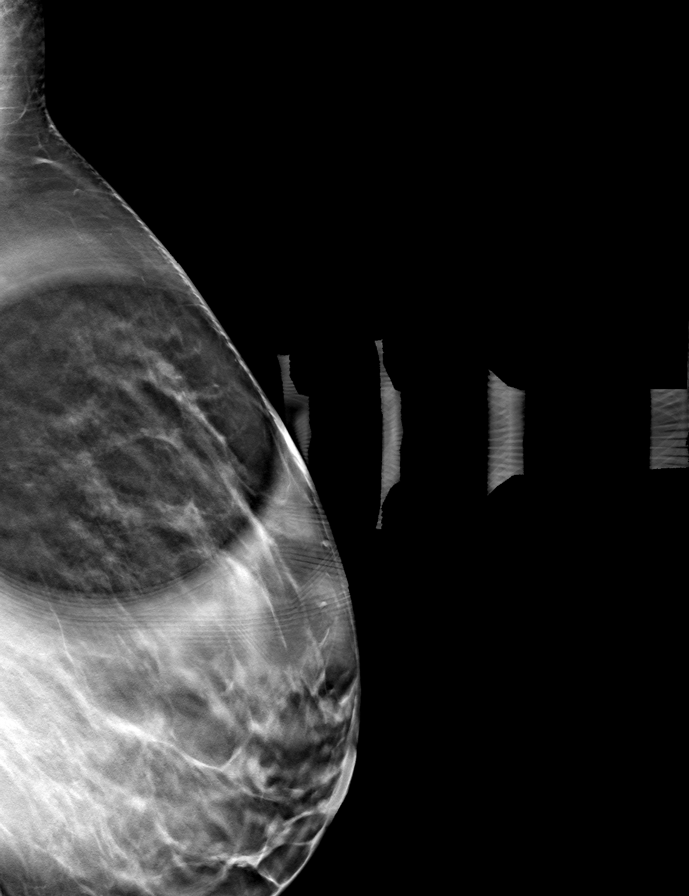

[8 of 40 positions shown; findings below may reference images not displayed]

ACR Breast Density Category b: There are scattered areas of
fibroglandular density.
FINDINGS: The possible mass on the left resolves on the 90 degree and spot
imaging. There is improvement on a repeat MLO view without complete
resolution.

The possible asymmetry and distortion resolves on the right on
today's imaging.

On physical exam, no suspicious lumps are identified.

Targeted ultrasound is performed, showing no sonographic correlate
for the left breast asymmetry/mass.
IMPRESSION: Probably benign left breast asymmetry/possible mass with no
sonographic correlate. No other suspicious findings.

RECOMMENDATION:
Six-month follow-up mammography of the left breast
asymmetry/possible mass.

I have discussed the findings and recommendations with the patient.
If applicable, a reminder letter will be sent to the patient
regarding the next appointment.

BI-RADS CATEGORY  3: Probably benign.

## 2022-03-13 ENCOUNTER — Ambulatory Visit
Admission: EM | Admit: 2022-03-13 | Discharge: 2022-03-13 | Disposition: A | Discharge status: Home / Self Care | Payer: 59 | Attending: Nurse Practitioner | Admitting: Nurse Practitioner

## 2022-03-13 DIAGNOSIS — J069 Acute upper respiratory infection, unspecified: Secondary | ICD-10-CM | POA: Insufficient documentation

## 2022-03-13 DIAGNOSIS — R0600 Dyspnea, unspecified: Secondary | ICD-10-CM | POA: Diagnosis not present

## 2022-03-13 DIAGNOSIS — E785 Hyperlipidemia, unspecified: Secondary | ICD-10-CM | POA: Diagnosis not present

## 2022-03-13 DIAGNOSIS — Z87891 Personal history of nicotine dependence: Secondary | ICD-10-CM | POA: Diagnosis not present

## 2022-03-13 DIAGNOSIS — Z28310 Unvaccinated for covid-19: Secondary | ICD-10-CM | POA: Diagnosis not present

## 2022-03-13 DIAGNOSIS — E876 Hypokalemia: Secondary | ICD-10-CM | POA: Insufficient documentation

## 2022-03-13 DIAGNOSIS — Z1152 Encounter for screening for COVID-19: Secondary | ICD-10-CM | POA: Diagnosis not present

## 2022-03-13 DIAGNOSIS — I10 Essential (primary) hypertension: Secondary | ICD-10-CM | POA: Diagnosis not present

## 2022-03-13 DIAGNOSIS — H9209 Otalgia, unspecified ear: Secondary | ICD-10-CM | POA: Insufficient documentation

## 2022-03-13 DIAGNOSIS — R051 Acute cough: Secondary | ICD-10-CM

## 2022-03-13 LAB — GROUP A STREP BY PCR: Group A Strep by PCR: NOT DETECTED

## 2022-03-13 LAB — RESP PANEL BY RT-PCR (RSV, FLU A&B, COVID)  RVPGX2
Influenza A by PCR: NEGATIVE
Influenza B by PCR: NEGATIVE
Resp Syncytial Virus by PCR: NEGATIVE
SARS Coronavirus 2 by RT PCR: NEGATIVE

## 2022-03-13 MED ORDER — BENZONATATE 200 MG PO CAPS: 200.0000 mg | ORAL_CAPSULE | Freq: Three times a day (TID) | ORAL | 0 refills | Status: DC | PRN

## 2022-03-13 NOTE — ED Provider Notes (Signed)
[facility]    CSN: [id number] Arrival date & time: 03/13/22  1637      History   Chief Complaint Chief Complaint  Patient presents with   Nasal Congestion   Sore Throat   Facial Pain   Cough   Otalgia         HPI Theresa Bowen is a 42 y.o. female  who presents for evaluation of URI symptoms for 3 days. Patient reports associated symptoms of cough, congestion, sore throat, ear pain. Denies N/V/D, fevers, body aches, shortness of breath. Patient does not have a hx of asthma.  She is a previous smoker.  Son and daughter have similar symptoms.  No recent travel. Pt is not vaccinated for COVID. Pt is not vaccinated for flu this season. Pt has taken cough medicine OTC for symptoms. Pt has no other concerns at this time.    Sore Throat  Cough Associated symptoms: ear pain and sore throat   Otalgia Associated symptoms: congestion, cough and sore throat     Past Medical History:  Diagnosis Date   Abnormal thyroid function test 07/22/2014   Dyspnea    Hyperlipidemia 04/29/2017   Hypertension    Hypokalemia    IDA (iron deficiency anemia) 07/22/2014   Referred otalgia of left ear 12/28/2018    Patient Active Problem List   Diagnosis Date Noted   Iron deficiency anemia due to chronic blood loss 04/12/2021   Left breast mass 03/15/2021   Myalgia 10/19/2020   Muscle spasms of neck 09/09/2019   Carpal tunnel syndrome on both sides 09/09/2019   Moderate single current episode of major depressive disorder ([place]) 04/07/2019   Laryngopharyngeal reflux 12/28/2018   Allergic rhinitis 12/28/2018   Muscle tension dysphonia 12/28/2018   Hyperlipidemia, mild 04/29/2017   Anxiety 07/22/2014   Abnormal finding on thyroid function test 07/22/2014   Essential (primary) hypertension 07/22/2014   Anemia, iron deficiency 07/22/2014   Local edema 07/22/2014   LBP (low back pain) 07/22/2014    Past Surgical History:  Procedure Laterality Date   c-section  2004    LAPAROSCOPIC BILATERAL SALPINGECTOMY Bilateral 09/07/2021   Procedure: LAPAROSCOPIC BILATERAL SALPINGECTOMY;  Surgeon: Benjaman Kindler, MD;  Location: [facility];  Service: Gynecology;  Laterality: Bilateral;   LAPAROSCOPIC SUPRACERVICAL HYSTERECTOMY N/A 09/07/2021   Procedure: LAPAROSCOPIC SUPRACERVICAL HYSTERECTOMY;  Surgeon: Benjaman Kindler, MD;  Location: [facility];  Service: Gynecology;  Laterality: N/A;   TONSILLECTOMY     TUBAL LIGATION  2016    OB History     Gravida  4   Para  1   Term  1   Preterm      AB  1   Living  1      SAB      IAB      Ectopic  1   Multiple      Live Births               Home Medications    Prior to Admission medications   Medication Sig Start Date End Date Taking? Authorizing Provider  albuterol (VENTOLIN HFA) 108 (90 Base) MCG/ACT inhaler Inhale 2 puffs into the lungs every 6 (six) hours as needed for wheezing or shortness of breath. 06/13/20  Yes Glean Hess, MD  benzonatate (TESSALON) 200 MG capsule Take 1 capsule (200 mg total) by mouth 3 (three) times daily as needed for cough. 03/13/22  Yes Melynda Ripple, NP  cyanocobalamin (,VITAMIN B-12,) 1000 MCG/ML injection Inject into the  muscle. 06/18/21  Yes [provider]  docusate sodium (COLACE) 100 MG capsule Take 1 capsule (100 mg total) by mouth 2 (two) times daily. 04/12/21  Yes Earlie Server, MD  docusate sodium (COLACE) 100 MG capsule Take 1 capsule (100 mg total) by mouth 2 (two) times daily. To keep stools soft 09/07/21  Yes Benjaman Kindler, MD  ergocalciferol (VITAMIN D2) 1.25 MG (50000 UT) capsule Take by mouth. 04/03/21  Yes [provider]  ferrous sulfate 325 (65 FE) MG EC tablet Take 1 tablet (325 mg total) by mouth 2 (two) times daily with a meal. 04/12/21  Yes Earlie Server, MD  fluticasone [facility]) 50 MCG/ACT nasal spray Place 2 sprays into both nostrils daily. 06/13/20  Yes Glean Hess, MD  ibuprofen (ADVIL) 600 MG tablet TAKE 1 TABLET(600 MG) BY MOUTH  EVERY 8 HOURS AS NEEDED 02/14/21  Yes Glean Hess, MD  ipratropium (ATROVENT) 0.06 % nasal spray Place 2 sprays into both nostrils 4 (four) times daily. 02/06/21  Yes Margarette Canada, NP  lisinopril-hydrochlorothiazide (ZESTORETIC) 20-12.5 MG tablet TAKE 1 TABLET BY MOUTH DAILY 12/21/20  Yes Glean Hess, MD  Multiple Vitamins-Minerals (MULTIVITAMIN WITH MINERALS) tablet Take 1 tablet by mouth.   Yes [provider]  oxyCODONE (OXY IR/ROXICODONE) 5 MG immediate release tablet Take 1 tablet (5 mg total) by mouth every 4 (four) hours as needed for severe pain. 09/07/21  Yes Benjaman Kindler, MD  potassium chloride (KLOR-CON) 10 MEQ tablet Take 1 tablet (10 mEq total) by mouth 2 (two) times daily. 03/07/21  Yes Glean Hess, MD  gabapentin (NEURONTIN) 800 MG tablet Take 0.5 tablets (400 mg total) by mouth at bedtime for 14 days. Take nightly for 3 days, then up to 14 days as needed 09/07/21 09/21/21  Benjaman Kindler, MD    Family History Family History  Problem Relation Age of Onset   CAD Mother    Atrial fibrillation Mother    CAD Father    Sudden Cardiac Death Brother    Stroke Brother 76   Sudden Cardiac Death Brother 38   Breast cancer Neg Hx     Social History Social History   Tobacco Use   Smoking status: Every Day    Packs/day: 0.50    Years: 15.00    Total pack years: 7.50    Types: Cigarettes    Start date: 2007   Smokeless tobacco: Never  Vaping Use   Vaping Use: Never used  Substance Use Topics   Alcohol use: No    Alcohol/week: 0.0 standard drinks of alcohol   Drug use: No     Allergies   Augmentin [amoxicillin-pot clavulanate]   Review of Systems Review of Systems  HENT:  Positive for congestion, ear pain and sore throat.   Respiratory:  Positive for cough.      Physical Exam Triage Vital Signs ED Triage Vitals  Enc Vitals Group     BP 03/13/22 1656 122/84     Pulse Rate 03/13/22 1656 92     Resp 03/13/22 1656 16     Temp  03/13/22 1656 98.5 F (36.9 C)     Temp Source 03/13/22 1656 Oral     SpO2 03/13/22 1656 96 %     Weight 03/13/22 1655 198 lb (89.8 kg)     Height 03/13/22 1655 '5\' 1"'$  (1.549 m)     Head Circumference --      Peak Flow --      Pain Score 03/13/22  1654 9     Pain Loc --      Pain Edu? --      Excl. in [place]? --    No data found.  Updated Vital Signs BP 122/84 (BP Location: Left Arm)   Pulse 92   Temp 98.5 F (36.9 C) (Oral)   Resp 16   Ht '5\' 1"'$  (1.549 m)   Wt 198 lb (89.8 kg)   LMP 01/27/2021   SpO2 96%   BMI 37.41 kg/m   Visual Acuity Right Eye Distance:   Left Eye Distance:   Bilateral Distance:    Right Eye Near:   Left Eye Near:    Bilateral Near:     Physical Exam Vitals and nursing note reviewed.  Constitutional:      General: She is not in acute distress.    Appearance: She is well-developed. She is not ill-appearing.  HENT:     Head: Normocephalic and atraumatic.     Right Ear: Tympanic membrane and ear canal normal.     Left Ear: Tympanic membrane and ear canal normal.     Nose: Congestion present.     Mouth/Throat:     Mouth: Mucous membranes are moist.     Pharynx: Oropharynx is clear. Uvula midline. Posterior oropharyngeal erythema present.     Tonsils: No tonsillar exudate or tonsillar abscesses.  Eyes:     Conjunctiva/sclera: Conjunctivae normal.     Pupils: Pupils are equal, round, and reactive to light.  Cardiovascular:     Rate and Rhythm: Normal rate and regular rhythm.     Heart sounds: Normal heart sounds.  Pulmonary:     Effort: Pulmonary effort is normal.     Breath sounds: Normal breath sounds.  Musculoskeletal:     Cervical back: Normal range of motion and neck supple.  Lymphadenopathy:     Cervical: No cervical adenopathy.  Skin:    General: Skin is warm and dry.  Neurological:     General: No focal deficit present.     Mental Status: She is alert and oriented to person, place, and time.  Psychiatric:        Mood and Affect:  Mood normal.        Behavior: Behavior normal.      UC Treatments / Results  Labs (all labs ordered are listed, but only abnormal results are displayed) Labs Reviewed  GROUP A STREP BY PCR  RESP PANEL BY RT-PCR (RSV, FLU A&B, COVID)  [id number]    EKG   Radiology No results found.  Procedures Procedures (including critical care time)  Medications Ordered in UC Medications - No data to display  Initial Impression / Assessment and Plan / UC Course  I have reviewed the triage vital signs and the nursing notes.  Pertinent labs & imaging results that were available during my care of the patient were reviewed by me and considered in my medical decision making (see chart for details).     Reviewed exam and symptoms with patient.  No red flags on exam. COVID and flu PCR will contact with results Negative strep PCR Discussed viral illness and symptomatic treatment Tessalon as needed cough Rest and fluids OTC Flonase daily Humidifier at night Follow-up with PCP 2 to 3 days for recheck ER precautions reviewed and patient verbalized understanding Final Clinical Impressions(s) / UC Diagnoses   Final diagnoses:  Viral upper respiratory tract infection  Acute cough     Discharge Instructions      Tessalon  as needed for cough Rest and fluids Over-the-counter Flonase daily Humidifier at night Follow-up with your PCP 2 to 3 days for recheck Please go to emergency room if you have any worsening symptoms    ED Prescriptions     Medication Sig Dispense Auth. Provider   benzonatate (TESSALON) 200 MG capsule Take 1 capsule (200 mg total) by mouth 3 (three) times daily as needed for cough. 20 capsule Melynda Ripple, NP      PDMP not reviewed this encounter.   Melynda Ripple, NP 03/13/22 1739

## 2022-03-13 NOTE — Discharge Instructions (Signed)
Tessalon as needed for cough Rest and fluids Over-the-counter Flonase daily Humidifier at night Follow-up with your PCP 2 to 3 days for recheck Please go to emergency room if you have any worsening symptoms

## 2022-03-13 NOTE — ED Triage Notes (Signed)
Pt c/o nasal congestion,sore throat,facial pain,cough & otalgia x3 days.

## 2022-04-05 ENCOUNTER — Encounter: Payer: Self-pay | Admitting: Oncology

## 2022-04-17 DIAGNOSIS — D500 Iron deficiency anemia secondary to blood loss (chronic): Secondary | ICD-10-CM | POA: Diagnosis not present

## 2022-04-17 DIAGNOSIS — E538 Deficiency of other specified B group vitamins: Secondary | ICD-10-CM | POA: Diagnosis not present

## 2022-04-17 DIAGNOSIS — I10 Essential (primary) hypertension: Secondary | ICD-10-CM | POA: Diagnosis not present

## 2022-04-24 DIAGNOSIS — R7303 Prediabetes: Secondary | ICD-10-CM | POA: Diagnosis not present

## 2022-04-24 DIAGNOSIS — D500 Iron deficiency anemia secondary to blood loss (chronic): Secondary | ICD-10-CM | POA: Diagnosis not present

## 2022-04-24 DIAGNOSIS — E7849 Other hyperlipidemia: Secondary | ICD-10-CM | POA: Diagnosis not present

## 2022-04-24 DIAGNOSIS — Z1231 Encounter for screening mammogram for malignant neoplasm of breast: Secondary | ICD-10-CM | POA: Diagnosis not present

## 2022-04-24 DIAGNOSIS — Z0000 Encounter for general adult medical examination without abnormal findings: Secondary | ICD-10-CM | POA: Diagnosis not present

## 2022-04-24 DIAGNOSIS — E559 Vitamin D deficiency, unspecified: Secondary | ICD-10-CM | POA: Diagnosis not present

## 2022-04-24 DIAGNOSIS — I10 Essential (primary) hypertension: Secondary | ICD-10-CM | POA: Diagnosis not present

## 2022-04-24 DIAGNOSIS — E010 Iodine-deficiency related diffuse (endemic) goiter: Secondary | ICD-10-CM | POA: Diagnosis not present

## 2022-04-24 DIAGNOSIS — E538 Deficiency of other specified B group vitamins: Secondary | ICD-10-CM | POA: Diagnosis not present

## 2022-04-25 ENCOUNTER — Other Ambulatory Visit: Payer: Self-pay | Admitting: Internal Medicine

## 2022-04-25 DIAGNOSIS — Z1231 Encounter for screening mammogram for malignant neoplasm of breast: Secondary | ICD-10-CM

## 2022-06-20 DIAGNOSIS — E010 Iodine-deficiency related diffuse (endemic) goiter: Secondary | ICD-10-CM | POA: Diagnosis not present

## 2022-06-25 ENCOUNTER — Telehealth: Payer: 59 | Admitting: Physician Assistant

## 2022-06-25 DIAGNOSIS — B9689 Other specified bacterial agents as the cause of diseases classified elsewhere: Secondary | ICD-10-CM | POA: Diagnosis not present

## 2022-06-25 DIAGNOSIS — J019 Acute sinusitis, unspecified: Secondary | ICD-10-CM

## 2022-06-25 MED ORDER — DOXYCYCLINE HYCLATE 100 MG PO TABS: 100.0000 mg | ORAL_TABLET | Freq: Two times a day (BID) | ORAL | 0 refills | Status: DC

## 2022-06-25 NOTE — Progress Notes (Signed)
E-Visit for Sinus Problems  We are sorry that you are not feeling well.  Here is how we plan to help!  Based on what you have shared with me it looks like you have sinusitis.  Sinusitis is inflammation and infection in the sinus cavities of the head.  Based on your presentation I believe you most likely have Acute Bacterial Sinusitis.  This is an infection caused by bacteria and is treated with antibiotics. I have prescribed Doxycycline 100mg by mouth twice a day for 10 days. You may use an oral decongestant such as Mucinex D or if you have glaucoma or high blood pressure use plain Mucinex. Saline nasal spray help and can safely be used as often as needed for congestion.  If you develop worsening sinus pain, fever or notice severe headache and vision changes, or if symptoms are not better after completion of antibiotic, please schedule an appointment with a health care provider.    Sinus infections are not as easily transmitted as other respiratory infection, however we still recommend that you avoid close contact with loved ones, especially the very young and elderly.  Remember to wash your hands thoroughly throughout the day as this is the number one way to prevent the spread of infection!  Home Care: Only take medications as instructed by your medical team. Complete the entire course of an antibiotic. Do not take these medications with alcohol. A steam or ultrasonic humidifier can help congestion.  You can place a towel over your head and breathe in the steam from hot water coming from a faucet. Avoid close contacts especially the very young and the elderly. Cover your mouth when you cough or sneeze. Always remember to wash your hands.  Get Help Right Away If: You develop worsening fever or sinus pain. You develop a severe head ache or visual changes. Your symptoms persist after you have completed your treatment plan.  Make sure you Understand these instructions. Will watch your  condition. Will get help right away if you are not doing well or get worse.  Thank you for choosing an e-visit.  Your e-visit answers were reviewed by a board certified advanced clinical practitioner to complete your personal care plan. Depending upon the condition, your plan could have included both over the counter or prescription medications.  Please review your pharmacy choice. Make sure the pharmacy is open so you can pick up prescription now. If there is a problem, you may contact your provider through MyChart messaging and have the prescription routed to another pharmacy.  Your safety is important to us. If you have drug allergies check your prescription carefully.   For the next 24 hours you can use MyChart to ask questions about today's visit, request a non-urgent call back, or ask for a work or school excuse. You will get an email in the next two days asking about your experience. I hope that your e-visit has been valuable and will speed your recovery.  I have spent 5 minutes in review of e-visit questionnaire, review and updating patient chart, medical decision making and response to patient.   Devarius Nelles M Melynda Krzywicki, PA-C  

## 2022-07-02 DIAGNOSIS — M25562 Pain in left knee: Secondary | ICD-10-CM | POA: Diagnosis not present

## 2022-07-02 DIAGNOSIS — M222X2 Patellofemoral disorders, left knee: Secondary | ICD-10-CM | POA: Diagnosis not present

## 2022-10-17 DIAGNOSIS — R7303 Prediabetes: Secondary | ICD-10-CM | POA: Diagnosis not present

## 2022-10-17 DIAGNOSIS — R8290 Unspecified abnormal findings in urine: Secondary | ICD-10-CM | POA: Diagnosis not present

## 2022-10-17 DIAGNOSIS — I10 Essential (primary) hypertension: Secondary | ICD-10-CM | POA: Diagnosis not present

## 2022-10-24 DIAGNOSIS — E7849 Other hyperlipidemia: Secondary | ICD-10-CM | POA: Diagnosis not present

## 2022-10-24 DIAGNOSIS — E559 Vitamin D deficiency, unspecified: Secondary | ICD-10-CM | POA: Diagnosis not present

## 2022-10-24 DIAGNOSIS — I10 Essential (primary) hypertension: Secondary | ICD-10-CM | POA: Diagnosis not present

## 2022-10-24 DIAGNOSIS — M222X2 Patellofemoral disorders, left knee: Secondary | ICD-10-CM | POA: Diagnosis not present

## 2022-11-19 DIAGNOSIS — B9689 Other specified bacterial agents as the cause of diseases classified elsewhere: Secondary | ICD-10-CM | POA: Diagnosis not present

## 2022-11-19 DIAGNOSIS — J019 Acute sinusitis, unspecified: Secondary | ICD-10-CM | POA: Diagnosis not present

## 2023-01-13 ENCOUNTER — Other Ambulatory Visit: Payer: Self-pay

## 2023-01-13 ENCOUNTER — Emergency Department (HOSPITAL_COMMUNITY): Payer: 59

## 2023-01-13 ENCOUNTER — Inpatient Hospital Stay (HOSPITAL_BASED_OUTPATIENT_CLINIC_OR_DEPARTMENT_OTHER)
Admit: 2023-01-13 | Discharge: 2023-01-13 | Disposition: A | Discharge status: Home / Self Care | Payer: 59 | Attending: Internal Medicine | Admitting: Internal Medicine

## 2023-01-13 ENCOUNTER — Encounter (HOSPITAL_COMMUNITY): Payer: Self-pay

## 2023-01-13 ENCOUNTER — Emergency Department (HOSPITAL_COMMUNITY)
Admission: EM | Admit: 2023-01-13 | Discharge: 2023-01-13 | Disposition: A | Discharge status: Home / Self Care | Payer: 59 | Attending: Emergency Medicine | Admitting: Emergency Medicine

## 2023-01-13 DIAGNOSIS — R0602 Shortness of breath: Secondary | ICD-10-CM | POA: Diagnosis not present

## 2023-01-13 DIAGNOSIS — R079 Chest pain, unspecified: Secondary | ICD-10-CM | POA: Diagnosis present

## 2023-01-13 DIAGNOSIS — E876 Hypokalemia: Secondary | ICD-10-CM | POA: Diagnosis not present

## 2023-01-13 DIAGNOSIS — Z79899 Other long term (current) drug therapy: Secondary | ICD-10-CM | POA: Insufficient documentation

## 2023-01-13 DIAGNOSIS — I10 Essential (primary) hypertension: Secondary | ICD-10-CM | POA: Insufficient documentation

## 2023-01-13 DIAGNOSIS — R002 Palpitations: Secondary | ICD-10-CM | POA: Diagnosis not present

## 2023-01-13 LAB — BASIC METABOLIC PANEL
Anion gap: 9 (ref 5–15)
BUN: <5 mg/dL — ABNORMAL LOW (ref 6–20)
CO2: 25 mmol/L (ref 22–32)
Calcium: 9.2 mg/dL (ref 8.9–10.3)
Chloride: 107 mmol/L (ref 98–111)
Creatinine, Ser: 0.54 mg/dL (ref 0.44–1.00)
GFR, Estimated: >60 mL/min (ref >60)
Glucose, Bld: 81 mg/dL (ref 70–99)
Potassium: 3.3 mmol/L — ABNORMAL LOW (ref 3.5–5.1)
Sodium: 141 mmol/L (ref 135–145)

## 2023-01-13 LAB — BRAIN NATRIURETIC PEPTIDE: B Natriuretic Peptide: 22.7 pg/mL (ref 0.0–100.0)

## 2023-01-13 LAB — CBC
HCT: 39 % (ref 36.0–46.0)
Hemoglobin: 12.4 g/dL (ref 12.0–15.0)
MCH: 26.4 pg (ref 26.0–34.0)
MCHC: 31.8 g/dL (ref 30.0–36.0)
MCV: 83 fL (ref 80.0–100.0)
Platelets: 230 10*3/uL (ref 150–400)
RBC: 4.7 MIL/uL (ref 3.87–5.11)
RDW: 14.2 % (ref 11.5–15.5)
WBC: 6.1 10*3/uL (ref 4.0–10.5)
nRBC: 0 % (ref 0.0–0.2)

## 2023-01-13 LAB — TROPONIN I (HIGH SENSITIVITY)
Troponin I (High Sensitivity): 2 ng/L (ref <18)
Troponin I (High Sensitivity): 5 ng/L (ref <18)

## 2023-01-13 LAB — D-DIMER, QUANTITATIVE: D-Dimer, Quant: 0.42 ug{FEU}/mL (ref 0.00–0.50)

## 2023-01-13 MED ORDER — ACETAMINOPHEN 500 MG PO TABS
1000.0000 mg | ORAL_TABLET | Freq: Once | ORAL | Status: AC
Start: 2023-01-13 — End: 2023-01-13
  Administered 2023-01-13: 1000 mg via ORAL
  Filled 2023-01-13: qty 2

## 2023-01-13 NOTE — Discharge Instructions (Addendum)
You were seen in the ED today for palpitations and shortness of breath. Your bloodwork was reassuring and showed no abnormal heart enzymes, clotting factor, electrolytes, kidney function, or cell counts. Please wear your zio patch and follow up with your PCP and cardiology. Please return to the ED for chest pain, shortness of breath, severe vomiting or inability to keep down food, or any worsening symptom or concern.

## 2023-01-13 NOTE — ED Triage Notes (Signed)
Pt c/o right sided chest pressure, heart fluttering and SOB, right mid back painx2d.

## 2023-01-13 NOTE — Progress Notes (Signed)
ZIO AT applied at hospital.  Follow up with Dr. Concha Se [facility] 02/03/23.

## 2023-01-13 NOTE — Progress Notes (Addendum)
EDP requesting 7 day live Zio monitor to be placed prior to discharge as well as follow-up. Spoke with our EKG team to place Zio before leaving ER. Also scheduled new pt follow-up in [facility] 12/9. Update provided to EDP to relay this information to patient.

## 2023-01-13 NOTE — ED Notes (Signed)
Patient transported to X-ray 

## 2023-01-13 NOTE — ED Notes (Signed)
Patient discharged by this RN. Patient verbalizes understanding of instructions with no additional questions. Patient ambulatory to lobby with family.

## 2023-01-13 NOTE — ED Provider Notes (Signed)
[place] EM[facility] DEPART[facility] AT [facility] Provider Note   CSN: [id number] Arrival date & time: 01/13/23  4782     History  Chief Complaint  Patient presents with   Chest Pain   Shortness of Breath    Theresa Bowen is a 42 y.o. female.  Patient is a 42 year old female with a PMH of HTN, HLD who presented to the ED for palpitations and shortness of breath. Patient reports she has had intermittent episodes of palpitations and shortness of breath over the last 3 days. Denies associated chest pain. States shortness of breath is worse after walking around and when laying down. Denies abdominal pain, vomiting, diarrhea, fevers, cough, or congestion. Denies history of similar prior episodes. Denies cardiac history, history of PE, taking OCP's or blood thinners.    Chest Pain Associated symptoms: shortness of breath   Shortness of Breath Associated symptoms: chest pain        Home Medications Prior to Admission medications   Medication Sig Start Date End Date Taking? Authorizing Provider  albuterol (VENTOLIN HFA) 108 (90 Base) MCG/ACT inhaler Inhale 2 puffs into the lungs every 6 (six) hours as needed for wheezing or shortness of breath. 06/13/20   Reubin Milan, MD  atorvastatin (LIPITOR) 40 MG tablet Take 40 mg by mouth daily.    [provider]  benzonatate (TESSALON) 200 MG capsule Take 1 capsule (200 mg total) by mouth 3 (three) times daily as needed for cough. 03/13/22   Radford Pax, NP  cyanocobalamin (,VITAMIN B-12,) 1000 MCG/ML injection Inject into the muscle. 06/18/21   [provider]  docusate sodium (COLACE) 100 MG capsule Take 1 capsule (100 mg total) by mouth 2 (two) times daily. 04/12/21   Rickard Patience, MD  docusate sodium (COLACE) 100 MG capsule Take 1 capsule (100 mg total) by mouth 2 (two) times daily. To keep stools soft 09/07/21   Christeen Douglas, MD  doxycycline (VIBRA-TABS) 100 MG tablet Take 1 tablet (100 mg total) by mouth 2  (two) times daily. 06/25/22   Margaretann Loveless, PA-C  ergocalciferol (VITAMIN D2) 1.25 MG (50000 UT) capsule Take by mouth. 04/03/21   [provider]  ferrous sulfate 325 (65 FE) MG EC tablet Take 1 tablet (325 mg total) by mouth 2 (two) times daily with a meal. 04/12/21   Rickard Patience, MD  fluticasone [facility]) 50 MCG/ACT nasal spray Place 2 sprays into both nostrils daily. 06/13/20   Reubin Milan, MD  gabapentin (NEURONTIN) 800 MG tablet Take 0.5 tablets (400 mg total) by mouth at bedtime for 14 days. Take nightly for 3 days, then up to 14 days as needed 09/07/21 09/21/21  Christeen Douglas, MD  ibuprofen (ADVIL) 600 MG tablet TAKE 1 TABLET(600 MG) BY MOUTH EVERY 8 HOURS AS NEEDED 02/14/21   Reubin Milan, MD  ipratropium (ATROVENT) 0.06 % nasal spray Place 2 sprays into both nostrils 4 (four) times daily. 02/06/21   Becky Augusta, NP  lisinopril-hydrochlorothiazide (ZESTORETIC) 20-12.5 MG tablet TAKE 1 TABLET BY MOUTH DAILY 12/21/20   Reubin Milan, MD  Multiple Vitamins-Minerals (MULTIVITAMIN WITH MINERALS) tablet Take 1 tablet by mouth.    [provider]  oxyCODONE (OXY IR/ROXICODONE) 5 MG immediate release tablet Take 1 tablet (5 mg total) by mouth every 4 (four) hours as needed for severe pain. 09/07/21   Christeen Douglas, MD  potassium chloride (KLOR-CON) 10 MEQ tablet Take 1 tablet (10 mEq total) by mouth 2 (two) times  daily. 03/07/21   Reubin Milan, MD      Allergies    Augmentin [amoxicillin-pot clavulanate]    Review of Systems   Review of Systems  Respiratory:  Positive for shortness of breath.   Cardiovascular:  Positive for chest pain.    Physical Exam Updated Vital Signs BP (!) 124/112   Pulse 92   Temp 98.7 F (37.1 C) (Oral)   Resp (!) 25   Ht 5\' 1"  (1.549 m)   Wt 89.8 kg   LMP 01/27/2021   SpO2 100%   BMI 37.41 kg/m  Physical Exam Constitutional:      General: She is not in acute distress.    Appearance: She is well-developed. She is  not ill-appearing.  HENT:     Head: Normocephalic and atraumatic.  Eyes:     Extraocular Movements: Extraocular movements intact.     Pupils: Pupils are equal, round, and reactive to light.  Cardiovascular:     Rate and Rhythm: Normal rate and regular rhythm.     Heart sounds: No murmur heard.    No friction rub. No gallop.  Pulmonary:     Effort: Pulmonary effort is normal.     Breath sounds: Normal breath sounds. No wheezing, rhonchi or rales.  Abdominal:     Palpations: Abdomen is soft. There is no mass.     Tenderness: There is no guarding or rebound.  Musculoskeletal:        General: Normal range of motion.     Cervical back: Normal range of motion and neck supple.     Right lower leg: No edema.     Left lower leg: No edema.     Comments: Bilateral radial pulses 2+  Skin:    General: Skin is warm and dry.     Capillary Refill: Capillary refill takes less than 2 seconds.  Neurological:     Mental Status: She is alert.   ED Results / Procedures / Treatments   Labs (all labs ordered are listed, but only abnormal results are displayed) Labs Reviewed  BASIC METABOLIC PANEL  CBC  BRAIN NATRIURETIC PEPTIDE  D-DIMER, QUANTITATIVE  TROPONIN I (HIGH SENSITIVITY)  TROPONIN I (HIGH SENSITIVITY)    EKG None  Radiology No results found.  Procedures Procedures    Medications Ordered in ED Medications - No data to display  ED Course/ Medical Decision Making/ A&P                                 Medical Decision Making Amount and/or Complexity of Data Reviewed Labs: ordered. Radiology: ordered.  Risk OTC drugs.   BMP with mild hypokalemia, no other gross metabolic or electrolyte abnormality. CBC with no anemia or leukocytosis. Troponin 2, 5 on repeat. D-dimer within normal limits. BNP within normal limits. CXR with no acute cardiopulmonary abnormality. I have low suspicion for ACS, aortic dissection, pneumothorax, pneumonia. Patient had a low-risk Wells score,  D-dimer ordered and within normal limits, therefore I have low suspicion for PE. EKG without concern for arrhythmia, however given patient's symptoms the cardiology team was contacted and placed a holter monitor in the ED. They have scheduled the patient for outpatient follow up. I discussed the results of the laboratory and imaging evaluations with the patient and her mom at bedside. Discussed zio patch, PCP, and cardiology follow up. Strict return precautions were discussed, and patient voiced understanding. She was discharged in stable  condition.        Final Clinical Impression(s) / ED Diagnoses Final diagnoses:  None    Rx / DC Orders ED Discharge Orders     None         Janyth Pupa, MD 01/13/23 1535    Blane Ohara, MD 01/14/23 1512

## 2023-01-13 NOTE — ED Triage Notes (Signed)
Pt was stuck. Wasn't successful.

## 2023-01-31 DIAGNOSIS — R8290 Unspecified abnormal findings in urine: Secondary | ICD-10-CM | POA: Diagnosis not present

## 2023-01-31 DIAGNOSIS — Z23 Encounter for immunization: Secondary | ICD-10-CM | POA: Diagnosis not present

## 2023-01-31 DIAGNOSIS — N343 Urethral syndrome, unspecified: Secondary | ICD-10-CM | POA: Diagnosis not present

## 2023-01-31 DIAGNOSIS — F4321 Adjustment disorder with depressed mood: Secondary | ICD-10-CM | POA: Diagnosis not present

## 2023-01-31 DIAGNOSIS — I10 Essential (primary) hypertension: Secondary | ICD-10-CM | POA: Diagnosis not present

## 2023-01-31 DIAGNOSIS — E7849 Other hyperlipidemia: Secondary | ICD-10-CM | POA: Diagnosis not present

## 2023-02-02 NOTE — Progress Notes (Unsigned)
NO SHOW

## 2023-02-03 ENCOUNTER — Ambulatory Visit: Payer: 59 | Attending: Cardiovascular Disease | Admitting: Cardiovascular Disease

## 2023-02-03 DIAGNOSIS — I10 Essential (primary) hypertension: Secondary | ICD-10-CM

## 2023-02-03 DIAGNOSIS — E785 Hyperlipidemia, unspecified: Secondary | ICD-10-CM

## 2023-02-03 DIAGNOSIS — R002 Palpitations: Secondary | ICD-10-CM

## 2023-02-03 DIAGNOSIS — R0602 Shortness of breath: Secondary | ICD-10-CM

## 2023-02-03 NOTE — Addendum Note (Signed)
Encounter addended by: Andee Lineman A on: 02/03/2023 9:39 AM  Actions taken: Imaging Exam ended

## 2023-02-04 ENCOUNTER — Encounter: Payer: Self-pay | Admitting: Cardiovascular Disease

## 2023-03-03 ENCOUNTER — Ambulatory Visit
Admission: EM | Admit: 2023-03-03 | Discharge: 2023-03-03 | Disposition: A | Discharge status: Home / Self Care | Payer: 59

## 2023-03-03 DIAGNOSIS — J069 Acute upper respiratory infection, unspecified: Secondary | ICD-10-CM | POA: Diagnosis not present

## 2023-03-03 LAB — POC COVID19/FLU A&B COMBO
Covid Antigen, POC: NEGATIVE
Influenza A Antigen, POC: NEGATIVE
Influenza B Antigen, POC: NEGATIVE

## 2023-03-03 LAB — POCT RAPID STREP A (OFFICE): Rapid Strep A Screen: NEGATIVE

## 2023-03-03 MED ORDER — BENZONATATE 100 MG PO CAPS: 100.0000 mg | ORAL_CAPSULE | Freq: Three times a day (TID) | ORAL | 0 refills | Status: DC | PRN

## 2023-03-03 NOTE — Discharge Instructions (Addendum)
 The strep, COVID and flu tests are negative.   Take the Tessalon  [place] as directed.  Take Tylenol  or ibuprofen  as needed for fever or discomfort.  Take plain Mucinex  as needed for congestion.  Rest and keep yourself hydrated.    Follow-up with your primary care provider if your symptoms are not improving.

## 2023-03-03 NOTE — ED Triage Notes (Signed)
 Patient to Urgent Care with complaints of sore throat/ headaches/ nasal congestion/ fevers/ bilateral ear pain.  Reports symptoms started Saturday night. Max temp 100.7.   Meds: ibuprofen.

## 2023-03-03 NOTE — ED Provider Notes (Signed)
 Theresa Bowen    CSN: [id number] Arrival date & time: 03/03/23  1004      History   Chief Complaint Chief Complaint  Patient presents with   Fever    HPI Theresa Bowen is a 43 y.o. female.  Patient presents with 1-2 day history of fever, headache, sore throat, congestion, earache, cough.  Tmax 100.7.  No OTC medications taken today; took ibuprofen  last night.  No chest pain, shortness of breath, vomiting, diarrhea.  Her medical history includes hypertension. The history is provided by the patient and medical records.    Past Medical History:  Diagnosis Date   Abnormal thyroid  function test 07/22/2014   Dyspnea    Hyperlipidemia 04/29/2017   Hypertension    Hypokalemia    IDA (iron  deficiency anemia) 07/22/2014   Referred otalgia of left ear 12/28/2018    Patient Active Problem List   Diagnosis Date Noted   Iron  deficiency anemia due to chronic blood loss 04/12/2021   Left breast mass 03/15/2021   Myalgia 10/19/2020   Muscle spasms of neck 09/09/2019   Carpal tunnel syndrome on both sides 09/09/2019   Moderate single current episode of major depressive disorder (HCC) 04/07/2019   Laryngopharyngeal reflux 12/28/2018   Allergic rhinitis 12/28/2018   Muscle tension dysphonia 12/28/2018   Hyperlipidemia, mild 04/29/2017   Anxiety 07/22/2014   Abnormal finding on thyroid  function test 07/22/2014   Essential (primary) hypertension 07/22/2014   Anemia, iron  deficiency 07/22/2014   Local edema 07/22/2014   Low back pain 07/22/2014    Past Surgical History:  Procedure Laterality Date   c-section  2004   LAPAROSCOPIC BILATERAL SALPINGECTOMY Bilateral 09/07/2021   Procedure: LAPAROSCOPIC BILATERAL SALPINGECTOMY;  Surgeon: Verdon Keen, MD;  Location: [facility] OR[facility];  Service: Gynecology;  Laterality: Bilateral;   LAPAROSCOPIC SUPRACERVICAL HYSTERECTOMY N/A 09/07/2021   Procedure: LAPAROSCOPIC SUPRACERVICAL HYSTERECTOMY;  Surgeon: Verdon Keen, MD;  Location:  [facility] OR[facility];  Service: Gynecology;  Laterality: N/A;   TONSILLECTOMY     TUBAL LIGATION  2016    OB History     Gravida  4   Para  1   Term  1   Preterm      AB  1   Living  1      SAB      IAB      Ectopic  1   Multiple      Live Births               Home Medications    Prior to Admission medications   Medication Sig Start Date End Date Taking? Authorizing Provider  benzonatate  (TESSALON ) 100 MG capsule Take 1 capsule (100 mg total) by mouth 3 (three) times daily as needed for cough. 03/03/23  Yes Corlis Burnard DEL, NP  acetaminophen  (TYLENOL ) 500 MG tablet Take 500-1,000 mg by mouth every 6 (six) hours as needed for moderate pain (pain score 4-6).    [provider]  albuterol  (VENTOLIN  HFA) 108 (90 Base) MCG/ACT inhaler Inhale 2 puffs into the lungs every 6 (six) hours as needed for wheezing or shortness of breath. 06/13/20   Justus Leita DEL, MD  atorvastatin  (LIPITOR) 40 MG tablet Take 40 mg by mouth daily.    [provider]  ergocalciferol (VITAMIN D2) 1.25 MG (50000 UT) capsule Take by mouth. Patient not taking: Reported on 01/13/2023 04/03/21   [provider]  ibuprofen  (ADVIL ) 600 MG tablet TAKE 1 TABLET(600 MG) BY MOUTH EVERY 8 HOURS AS NEEDED  Patient taking differently: Take 800 mg by mouth every 4 (four) hours as needed for mild pain (pain score 1-3). 02/14/21   Justus Leita DEL, MD  lisinopril -hydrochlorothiazide  (ZESTORETIC ) 20-12.5 MG tablet TAKE 1 TABLET BY MOUTH DAILY 12/21/20   Berglund, Laura H, MD  potassium chloride  (KLOR-CON ) 10 MEQ tablet Take 1 tablet (10 mEq total) by mouth 2 (two) times daily. 03/07/21   Berglund, Laura H, MD  sertraline (ZOLOFT) 25 MG tablet Take 1 tablet by mouth daily. Patient not taking: Reported on 03/03/2023 01/31/23 01/31/24  [provider]  traZODone (DESYREL) 50 MG tablet Take by mouth. Patient not taking: Reported on 03/03/2023 01/31/23 01/31/24  [provider]    Family  History Family History  Problem Relation Age of Onset   CAD Mother    Atrial fibrillation Mother    CAD Father    Sudden Cardiac Death Brother    Stroke Brother 53   Sudden Cardiac Death Brother 55   Breast cancer Neg Hx     Social History Social History   Tobacco Use   Smoking status: Every Day    Current packs/day: 0.50    Average packs/day: 0.5 packs/day for 18.0 years (9.0 ttl pk-yrs)    Types: Cigarettes    Start date: 2007   Smokeless tobacco: Never  Vaping Use   Vaping status: Never Used  Substance Use Topics   Alcohol use: No    Alcohol/week: 0.0 standard drinks of alcohol   Drug use: No     Allergies   Augmentin  [amoxicillin -pot clavulanate]   Review of Systems Review of Systems  Constitutional:  Positive for fever. Negative for chills.  HENT:  Positive for congestion, ear pain and sore throat.   Respiratory:  Positive for cough. Negative for shortness of breath.   Cardiovascular:  Negative for chest pain and palpitations.  Gastrointestinal:  Negative for diarrhea and vomiting.  Neurological:  Positive for headaches.     Physical Exam Triage Vital Signs ED Triage Vitals  Encounter Vitals Group     BP 03/03/23 1018 134/86     Systolic BP Percentile --      Diastolic BP Percentile --      Pulse Rate 03/03/23 1010 100     Resp 03/03/23 1010 18     Temp 03/03/23 1010 97.7 F (36.5 C)     Temp src --      SpO2 03/03/23 1010 98 %     Weight --      Height --      Head Circumference --      Peak Flow --      Pain Score 03/03/23 1013 8     Pain Loc --      Pain Education --      Exclude from Growth Chart --    No data found.  Updated Vital Signs BP 134/86   Pulse 100   Temp 97.7 F (36.5 C)   Resp 18   LMP 01/27/2021   SpO2 98%   Visual Acuity Right Eye Distance:   Left Eye Distance:   Bilateral Distance:    Right Eye Near:   Left Eye Near:    Bilateral Near:     Physical Exam Constitutional:      General: She is not in acute  distress. HENT:     Right Ear: Tympanic membrane normal.     Left Ear: Tympanic membrane normal.     Nose: Congestion and rhinorrhea present.  Mouth/Throat:     Mouth: Mucous membranes are moist.     Pharynx: Oropharynx is clear.  Cardiovascular:     Rate and Rhythm: Normal rate and regular rhythm.     Heart sounds: Normal heart sounds.  Pulmonary:     Effort: Pulmonary effort is normal. No respiratory distress.     Breath sounds: Normal breath sounds.  Neurological:     Mental Status: She is alert.      UC Treatments / Results  Labs (all labs ordered are listed, but only abnormal results are displayed) Labs Reviewed  POCT RAPID STREP A (OFFICE) - Normal  POC COVID19/FLU A&B COMBO    EKG   Radiology No results found.  Procedures Procedures (including critical care time)  Medications Ordered in UC Medications - No data to display  Initial Impression / Assessment and Plan / UC Course  I have reviewed the triage vital signs and the nursing notes.  Pertinent labs & imaging results that were available during my care of the patient were reviewed by me and considered in my medical decision making (see chart for details).    Viral URI.  Afebrile and vital signs are stable.  Lungs are clear, O2 sat 98%.  Rapid strep negative.  Rapid flu and COVID-negative.  Patient has been symptomatic for 1 to 2 days.  Treating cough with Tessalon  Perles. Discussed symptomatic treatment including Tylenol  or ibuprofen  as needed for fever or discomfort, plain Mucinex  as needed for congestion, rest, hydration.  Instructed patient to follow-up with PCP if not improving.  ED precautions given.  Patient agrees to plan of care.   Final Clinical Impressions(s) / UC Diagnoses   Final diagnoses:  Viral URI     Discharge Instructions      The strep, COVID and flu tests are negative.   Take the Tessalon  Perles as directed.  Take Tylenol  or ibuprofen  as needed for fever or discomfort.  Take  plain Mucinex  as needed for congestion.  Rest and keep yourself hydrated.    Follow-up with your primary care provider if your symptoms are not improving.         ED Prescriptions     Medication Sig Dispense Auth. Provider   benzonatate  (TESSALON ) 100 MG capsule Take 1 capsule (100 mg total) by mouth 3 (three) times daily as needed for cough. 21 capsule Corlis Burnard DEL, NP      PDMP not reviewed this encounter.   Corlis Burnard DEL, NP 03/03/23 1055

## 2023-03-20 ENCOUNTER — Telehealth: Payer: 59 | Admitting: Physician Assistant

## 2023-03-20 DIAGNOSIS — J019 Acute sinusitis, unspecified: Secondary | ICD-10-CM

## 2023-03-20 DIAGNOSIS — B9689 Other specified bacterial agents as the cause of diseases classified elsewhere: Secondary | ICD-10-CM

## 2023-03-20 MED ORDER — DOXYCYCLINE HYCLATE 100 MG PO TABS: 100.0000 mg | ORAL_TABLET | Freq: Two times a day (BID) | ORAL | 0 refills | Status: DC

## 2023-03-20 NOTE — Progress Notes (Signed)

## 2023-03-20 NOTE — Progress Notes (Signed)
I have spent 5 minutes in review of e-visit questionnaire, review and updating patient chart, medical decision making and response to patient.   Mia Milan Cody Jacklynn Dehaas, PA-C    

## 2023-05-07 DIAGNOSIS — F4321 Adjustment disorder with depressed mood: Secondary | ICD-10-CM | POA: Diagnosis not present

## 2023-05-07 DIAGNOSIS — R7303 Prediabetes: Secondary | ICD-10-CM | POA: Diagnosis not present

## 2023-05-07 DIAGNOSIS — G44229 Chronic tension-type headache, not intractable: Secondary | ICD-10-CM | POA: Diagnosis not present

## 2023-05-07 DIAGNOSIS — I10 Essential (primary) hypertension: Secondary | ICD-10-CM | POA: Diagnosis not present

## 2023-05-15 DIAGNOSIS — I10 Essential (primary) hypertension: Secondary | ICD-10-CM | POA: Diagnosis not present

## 2023-05-15 DIAGNOSIS — R0609 Other forms of dyspnea: Secondary | ICD-10-CM | POA: Diagnosis not present

## 2023-05-15 DIAGNOSIS — E782 Mixed hyperlipidemia: Secondary | ICD-10-CM | POA: Diagnosis not present

## 2023-05-15 DIAGNOSIS — G4733 Obstructive sleep apnea (adult) (pediatric): Secondary | ICD-10-CM | POA: Diagnosis not present

## 2023-05-15 DIAGNOSIS — Z7689 Persons encountering health services in other specified circumstances: Secondary | ICD-10-CM | POA: Diagnosis not present

## 2023-05-23 DIAGNOSIS — R0609 Other forms of dyspnea: Secondary | ICD-10-CM | POA: Diagnosis not present

## 2023-05-28 DIAGNOSIS — Z6841 Body Mass Index (BMI) 40.0 and over, adult: Secondary | ICD-10-CM | POA: Diagnosis not present

## 2023-05-28 DIAGNOSIS — G4733 Obstructive sleep apnea (adult) (pediatric): Secondary | ICD-10-CM | POA: Diagnosis not present

## 2023-07-02 DIAGNOSIS — G4733 Obstructive sleep apnea (adult) (pediatric): Secondary | ICD-10-CM | POA: Diagnosis not present

## 2023-07-02 DIAGNOSIS — R0609 Other forms of dyspnea: Secondary | ICD-10-CM | POA: Diagnosis not present

## 2023-07-02 DIAGNOSIS — E782 Mixed hyperlipidemia: Secondary | ICD-10-CM | POA: Diagnosis not present

## 2023-07-02 DIAGNOSIS — I10 Essential (primary) hypertension: Secondary | ICD-10-CM | POA: Diagnosis not present

## 2023-08-07 DIAGNOSIS — G44229 Chronic tension-type headache, not intractable: Secondary | ICD-10-CM | POA: Diagnosis not present

## 2023-08-07 DIAGNOSIS — I10 Essential (primary) hypertension: Secondary | ICD-10-CM | POA: Diagnosis not present

## 2023-08-07 DIAGNOSIS — E119 Type 2 diabetes mellitus without complications: Secondary | ICD-10-CM | POA: Diagnosis not present

## 2023-08-07 DIAGNOSIS — R002 Palpitations: Secondary | ICD-10-CM | POA: Diagnosis not present

## 2023-09-23 ENCOUNTER — Encounter: Payer: Self-pay | Admitting: Oncology

## 2023-09-29 ENCOUNTER — Encounter: Payer: Self-pay | Admitting: Oncology

## 2023-09-29 ENCOUNTER — Other Ambulatory Visit: Payer: Self-pay

## 2023-09-29 MED ORDER — ATORVASTATIN CALCIUM 40 MG PO TABS
40.0000 mg | ORAL_TABLET | Freq: Every day | ORAL | 1 refills | Status: DC
  Filled 2023-09-29: qty 90, 90d supply, fill #0
  Filled 2024-01-13: qty 90, 90d supply, fill #1

## 2023-09-29 MED ORDER — METOPROLOL SUCCINATE ER 25 MG PO TB24
25.0000 mg | ORAL_TABLET | Freq: Every day | ORAL | 11 refills | Status: AC
  Filled 2023-09-29: qty 30, 30d supply, fill #0
  Filled 2023-11-03: qty 30, 30d supply, fill #1
  Filled 2023-12-03: qty 30, 30d supply, fill #2
  Filled 2024-01-13: qty 30, 30d supply, fill #3
  Filled 2024-02-20: qty 30, 30d supply, fill #4
  Filled 2024-03-19: qty 30, 30d supply, fill #5

## 2023-09-29 MED ORDER — LISINOPRIL-HYDROCHLOROTHIAZIDE 20-12.5 MG PO TABS
1.0000 | ORAL_TABLET | Freq: Every day | ORAL | 11 refills | Status: AC
  Filled 2023-09-29: qty 30, 30d supply, fill #0
  Filled 2023-11-03: qty 30, 30d supply, fill #1
  Filled 2023-12-03: qty 30, 30d supply, fill #2
  Filled 2024-01-15: qty 30, 30d supply, fill #3
  Filled 2024-02-20: qty 30, 30d supply, fill #4
  Filled 2024-03-19: qty 30, 30d supply, fill #5

## 2023-10-01 ENCOUNTER — Other Ambulatory Visit: Payer: Self-pay

## 2023-10-01 ENCOUNTER — Telehealth: Admitting: Physician Assistant

## 2023-10-01 ENCOUNTER — Encounter: Payer: Self-pay | Admitting: Oncology

## 2023-10-01 DIAGNOSIS — J329 Chronic sinusitis, unspecified: Secondary | ICD-10-CM

## 2023-10-01 DIAGNOSIS — B9789 Other viral agents as the cause of diseases classified elsewhere: Secondary | ICD-10-CM | POA: Diagnosis not present

## 2023-10-01 MED ORDER — FLUTICASONE PROPIONATE 50 MCG/ACT NA SUSP
2.0000 | Freq: Every day | NASAL | 0 refills | Status: AC
  Filled 2023-10-01: qty 16, 30d supply, fill #0

## 2023-10-01 NOTE — Progress Notes (Signed)
 E-Visit for Sinus Problems  We are sorry that you are not feeling well.  Here is how we plan to help!  Based on what you have shared with me it looks like you have sinusitis.  Sinusitis is inflammation and infection in the sinus cavities of the head.  Based on your presentation I believe you most likely have Acute Viral Sinusitis.This is an infection most likely caused by a virus. There is not specific treatment for viral sinusitis other than to help you with the symptoms until the infection runs its course.  You may use an oral decongestant such as Mucinex D or if you have glaucoma or high blood pressure use plain Mucinex. Saline nasal spray help and can safely be used as often as needed for congestion, I have prescribed: Fluticasone nasal spray two sprays in each nostril once a day  Some authorities believe that zinc sprays or the use of Echinacea may shorten the course of your symptoms.  Sinus infections are not as easily transmitted as other respiratory infection, however we still recommend that you avoid close contact with loved ones, especially the very young and elderly.  Remember to wash your hands thoroughly throughout the day as this is the number one way to prevent the spread of infection!  Home Care: Only take medications as instructed by your medical team. Do not take these medications with alcohol. A steam or ultrasonic humidifier can help congestion.  You can place a towel over your head and breathe in the steam from hot water coming from a faucet. Avoid close contacts especially the very young and the elderly. Cover your mouth when you cough or sneeze. Always remember to wash your hands.  Get Help Right Away If: You develop worsening fever or sinus pain. You develop a severe head ache or visual changes. Your symptoms persist after you have completed your treatment plan.  Make sure you Understand these instructions. Will watch your condition. Will get help right away if you  are not doing well or get worse.   Thank you for choosing an e-visit.  Your e-visit answers were reviewed by a board certified advanced clinical practitioner to complete your personal care plan. Depending upon the condition, your plan could have included both over the counter or prescription medications.  Please review your pharmacy choice. Make sure the pharmacy is open so you can pick up prescription now. If there is a problem, you may contact your provider through [employer] and have the prescription routed to another pharmacy.  Your safety is important to [place]. If you have drug allergies check your prescription carefully.   For the next 24 hours you can use MyChart to ask questions about today's visit, request a non-urgent call back, or ask for a work or school excuse. You will get an email in the next two days asking about your experience. I hope that your e-visit has been valuable and will speed your recovery.  I have spent 5 minutes in review of e-visit questionnaire, review and updating patient chart, medical decision making and response to patient.   Margaretann Loveless, PA-C

## 2023-10-13 ENCOUNTER — Telehealth: Admitting: Physician Assistant

## 2023-10-13 ENCOUNTER — Other Ambulatory Visit: Payer: Self-pay

## 2023-10-13 DIAGNOSIS — B9689 Other specified bacterial agents as the cause of diseases classified elsewhere: Secondary | ICD-10-CM

## 2023-10-13 DIAGNOSIS — J019 Acute sinusitis, unspecified: Secondary | ICD-10-CM | POA: Diagnosis not present

## 2023-10-13 MED ORDER — DOXYCYCLINE HYCLATE 100 MG PO TABS
100.0000 mg | ORAL_TABLET | Freq: Two times a day (BID) | ORAL | 0 refills | Status: DC
  Filled 2023-10-13: qty 20, 10d supply, fill #0

## 2023-10-13 NOTE — Progress Notes (Signed)

## 2023-11-03 ENCOUNTER — Other Ambulatory Visit: Payer: Self-pay

## 2023-11-03 MED ORDER — POTASSIUM CHLORIDE ER 10 MEQ PO TBCR
10.0000 meq | EXTENDED_RELEASE_TABLET | Freq: Two times a day (BID) | ORAL | 1 refills | Status: AC
  Filled 2023-11-03: qty 180, 90d supply, fill #0
  Filled 2024-02-20 – 2024-03-13 (×2): qty 180, 90d supply, fill #1

## 2023-12-11 DIAGNOSIS — G4733 Obstructive sleep apnea (adult) (pediatric): Secondary | ICD-10-CM | POA: Diagnosis not present

## 2023-12-19 ENCOUNTER — Other Ambulatory Visit: Payer: Self-pay

## 2023-12-19 ENCOUNTER — Encounter: Payer: Self-pay | Admitting: Oncology

## 2023-12-19 DIAGNOSIS — Z6841 Body Mass Index (BMI) 40.0 and over, adult: Secondary | ICD-10-CM | POA: Diagnosis not present

## 2023-12-19 DIAGNOSIS — R0609 Other forms of dyspnea: Secondary | ICD-10-CM | POA: Diagnosis not present

## 2023-12-19 DIAGNOSIS — F17210 Nicotine dependence, cigarettes, uncomplicated: Secondary | ICD-10-CM | POA: Diagnosis not present

## 2023-12-19 DIAGNOSIS — G4733 Obstructive sleep apnea (adult) (pediatric): Secondary | ICD-10-CM | POA: Diagnosis not present

## 2023-12-19 MED ORDER — FLUZONE 0.5 ML IM SUSY
0.5000 mL | PREFILLED_SYRINGE | Freq: Once | INTRAMUSCULAR | 0 refills | Status: AC
  Filled 2023-12-19: qty 0.5, 1d supply, fill #0

## 2023-12-24 ENCOUNTER — Telehealth: Admitting: Physician Assistant

## 2023-12-24 DIAGNOSIS — J019 Acute sinusitis, unspecified: Secondary | ICD-10-CM

## 2023-12-24 DIAGNOSIS — B9689 Other specified bacterial agents as the cause of diseases classified elsewhere: Secondary | ICD-10-CM | POA: Diagnosis not present

## 2023-12-24 MED ORDER — DOXYCYCLINE HYCLATE 100 MG PO TABS
100.0000 mg | ORAL_TABLET | Freq: Two times a day (BID) | ORAL | 0 refills | Status: AC
  Filled 2023-12-24: qty 14, 7d supply, fill #0

## 2023-12-24 MED ORDER — PREDNISONE 20 MG PO TABS
40.0000 mg | ORAL_TABLET | Freq: Every day | ORAL | 0 refills | Status: AC
  Filled 2023-12-24: qty 10, 5d supply, fill #0

## 2023-12-24 NOTE — Progress Notes (Signed)
 E-Visit for Sinus Problems  We are sorry that you are not feeling well.  Here is how we plan to help!  Based on what you have shared with me it looks like you have sinusitis.  Sinusitis is inflammation and infection in the sinus cavities of the head.  Based on your presentation I believe you most likely have Acute Bacterial Sinusitis.  This is an infection caused by bacteria and is treated with antibiotics. I have prescribed Doxycycline  100mg  by mouth twice a day for 7 days. I have also prescribed Prednisone  20mg  Take 2 tablets (40mg ) daily for 5 days. You may use an oral decongestant such as Mucinex  D or if you have glaucoma or high blood pressure use plain Mucinex . Saline nasal spray help and can safely be used as often as needed for congestion.  If you develop worsening sinus pain, fever or notice severe headache and vision changes, or if symptoms are not better after completion of antibiotic, please schedule an appointment with a health care provider.    Sinus infections are not as easily transmitted as other respiratory infection, however we still recommend that you avoid close contact with loved ones, especially the very young and elderly.  Remember to wash your hands thoroughly throughout the day as this is the number one way to prevent the spread of infection!  Home Care: Only take medications as instructed by your medical team. Complete the entire course of an antibiotic. Do not take these medications with alcohol. A steam or ultrasonic humidifier can help congestion.  You can place a towel over your head and breathe in the steam from hot water coming from a faucet. Avoid close contacts especially the very young and the elderly. Cover your mouth when you cough or sneeze. Always remember to wash your hands.  Get Help Right Away If: You develop worsening fever or sinus pain. You develop a severe head ache or visual changes. Your symptoms persist after you have completed your treatment  plan.  Make sure you Understand these instructions. Will watch your condition. Will get help right away if you are not doing well or get worse.  Your e-visit answers were reviewed by a board certified advanced clinical practitioner to complete your personal care plan.  Depending on the condition, your plan could have included both over the counter or prescription medications.  If there is a problem please reply  once you have received a response from your provider.  Your safety is important to us .  If you have drug allergies check your prescription carefully.    You can use MyChart to ask questions about today's visit, request a non-urgent call back, or ask for a work or school excuse for 24 hours related to this e-Visit. If it has been greater than 24 hours you will need to follow up with your provider, or enter a new e-Visit to address those concerns.  You will get an e-mail in the next two days asking about your experience.  I hope that your e-visit has been valuable and will speed your recovery. Thank you for using e-visits.  I have spent 5 minutes in review of e-visit questionnaire, review and updating patient chart, medical decision making and response to patient.   Delon CHRISTELLA Dickinson, PA-C

## 2023-12-25 ENCOUNTER — Other Ambulatory Visit: Payer: Self-pay

## 2023-12-29 ENCOUNTER — Telehealth: Admitting: Emergency Medicine

## 2023-12-29 ENCOUNTER — Other Ambulatory Visit: Payer: Self-pay

## 2023-12-29 DIAGNOSIS — R059 Cough, unspecified: Secondary | ICD-10-CM

## 2023-12-29 MED ORDER — ALBUTEROL SULFATE HFA 108 (90 BASE) MCG/ACT IN AERS
2.0000 | INHALATION_SPRAY | RESPIRATORY_TRACT | 0 refills | Status: AC | PRN
  Filled 2023-12-29: qty 6.7, 17d supply, fill #0

## 2023-12-29 NOTE — Progress Notes (Signed)
  Because you were recently treated by [facility] and it seems you are not getting better, I feel your condition warrants further evaluation and I recommend that you be seen in a face-to-face visit.   NOTE: There will be NO CHARGE for this E-Visit   If you are having a true medical emergency, please call 911.     For an urgent face to face visit, [facility] has multiple urgent care centers for your convenience.  Click the link below for the full list of locations and hours, walk-in wait times, appointment scheduling options and driving directions:  Urgent Care - [place], [place], [place], [place], [place], [place]  [place]     Your MyChart E-visit questionnaire answers were reviewed by a board certified advanced clinical practitioner to complete your personal care plan based on your specific symptoms.    Thank you for using e-Visits.

## 2024-01-04 ENCOUNTER — Other Ambulatory Visit: Payer: Self-pay

## 2024-01-04 DIAGNOSIS — Z03818 Encounter for observation for suspected exposure to other biological agents ruled out: Secondary | ICD-10-CM | POA: Diagnosis not present

## 2024-01-04 DIAGNOSIS — J0110 Acute frontal sinusitis, unspecified: Secondary | ICD-10-CM | POA: Diagnosis not present

## 2024-01-04 DIAGNOSIS — J40 Bronchitis, not specified as acute or chronic: Secondary | ICD-10-CM | POA: Diagnosis not present

## 2024-01-04 MED ORDER — CEFDINIR 300 MG PO CAPS
300.0000 mg | ORAL_CAPSULE | Freq: Two times a day (BID) | ORAL | 0 refills | Status: AC
  Filled 2024-01-04 (×2): qty 14, 7d supply, fill #0

## 2024-01-21 ENCOUNTER — Other Ambulatory Visit: Payer: Self-pay

## 2024-01-21 DIAGNOSIS — I10 Essential (primary) hypertension: Secondary | ICD-10-CM | POA: Diagnosis not present

## 2024-01-21 DIAGNOSIS — G4733 Obstructive sleep apnea (adult) (pediatric): Secondary | ICD-10-CM | POA: Diagnosis not present

## 2024-01-21 DIAGNOSIS — E119 Type 2 diabetes mellitus without complications: Secondary | ICD-10-CM | POA: Diagnosis not present

## 2024-01-21 MED ORDER — MOUNJARO 2.5 MG/0.5ML ~~LOC~~ SOAJ
0.5000 mL | SUBCUTANEOUS | 1 refills | Status: AC
  Filled 2024-01-21: qty 2, 30d supply, fill #0
  Filled 2024-01-23: qty 2, 28d supply, fill #0

## 2024-01-23 ENCOUNTER — Other Ambulatory Visit: Payer: Self-pay

## 2024-01-25 DIAGNOSIS — G4733 Obstructive sleep apnea (adult) (pediatric): Secondary | ICD-10-CM | POA: Diagnosis not present

## 2024-02-10 ENCOUNTER — Other Ambulatory Visit: Payer: Self-pay

## 2024-02-10 MED ORDER — MOUNJARO 2.5 MG/0.5ML ~~LOC~~ SOAJ
2.5000 mg | SUBCUTANEOUS | 1 refills | Status: DC
  Filled 2024-02-10: qty 2, 28d supply, fill #0

## 2024-02-23 ENCOUNTER — Other Ambulatory Visit: Payer: Self-pay

## 2024-02-25 DIAGNOSIS — G4733 Obstructive sleep apnea (adult) (pediatric): Secondary | ICD-10-CM | POA: Diagnosis not present

## 2024-03-10 ENCOUNTER — Other Ambulatory Visit: Payer: Self-pay

## 2024-03-10 MED ORDER — MOUNJARO 5 MG/0.5ML ~~LOC~~ SOAJ
5.0000 mg | SUBCUTANEOUS | 1 refills | Status: AC
  Filled 2024-03-10: qty 2, 28d supply, fill #0

## 2024-03-13 ENCOUNTER — Other Ambulatory Visit: Payer: Self-pay

## 2024-03-19 ENCOUNTER — Other Ambulatory Visit: Payer: Self-pay

## 2024-03-19 ENCOUNTER — Encounter: Payer: Self-pay | Admitting: Oncology

## 2024-03-19 MED ORDER — ATORVASTATIN CALCIUM 40 MG PO TABS
40.0000 mg | ORAL_TABLET | Freq: Every day | ORAL | 1 refills | Status: AC
  Filled 2024-03-19: qty 90, 90d supply, fill #0
